# Patient Record
Sex: Female | Born: 1960 | Hispanic: Yes | Marital: Single
Health system: Southern US, Academic
[De-identification: ages and names within clinical notes are randomized; demographics above are authoritative.]

## PROBLEM LIST (undated history)

## (undated) ENCOUNTER — Ambulatory Visit

## (undated) ENCOUNTER — Encounter: Attending: Rheumatology | Primary: Rheumatology

## (undated) ENCOUNTER — Encounter: Attending: Federally Qualified Health Center (FQHC) | Primary: Federally Qualified Health Center (FQHC)

## (undated) ENCOUNTER — Ambulatory Visit: Attending: Ambulatory Care | Primary: Ambulatory Care

## (undated) ENCOUNTER — Encounter: Attending: Pediatrics | Primary: Pediatrics

## (undated) ENCOUNTER — Encounter

## (undated) ENCOUNTER — Encounter: Attending: General Acute Care Hospital | Primary: General Acute Care Hospital

## (undated) ENCOUNTER — Telehealth: Attending: Ambulatory Care | Primary: Ambulatory Care

## (undated) ENCOUNTER — Encounter: Attending: Ambulatory Care | Primary: Ambulatory Care

## (undated) ENCOUNTER — Telehealth

## (undated) ENCOUNTER — Telehealth: Attending: Rheumatology | Primary: Rheumatology

## (undated) ENCOUNTER — Ambulatory Visit: Attending: Family Medicine | Primary: Family Medicine

## (undated) ENCOUNTER — Encounter: Attending: Medical | Primary: Medical

## (undated) ENCOUNTER — Ambulatory Visit: Attending: Adult Health | Primary: Adult Health

## (undated) ENCOUNTER — Encounter
Attending: Student in an Organized Health Care Education/Training Program | Primary: Student in an Organized Health Care Education/Training Program

## (undated) ENCOUNTER — Ambulatory Visit
Attending: Rehabilitative and Restorative Service Providers" | Primary: Rehabilitative and Restorative Service Providers"

## (undated) ENCOUNTER — Ambulatory Visit: Attending: Physical Medicine & Rehabilitation | Primary: Physical Medicine & Rehabilitation

## (undated) ENCOUNTER — Encounter: Attending: Family Medicine | Primary: Family Medicine

## (undated) ENCOUNTER — Encounter: Attending: Family | Primary: Family

## (undated) ENCOUNTER — Ambulatory Visit: Attending: Rheumatology | Primary: Rheumatology

## (undated) DIAGNOSIS — M359 Systemic involvement of connective tissue, unspecified: Secondary | ICD-10-CM

## (undated) DIAGNOSIS — I10 Essential (primary) hypertension: Secondary | ICD-10-CM

## (undated) DIAGNOSIS — M5430 Sciatica, unspecified side: Secondary | ICD-10-CM

## (undated) DIAGNOSIS — M1990 Unspecified osteoarthritis, unspecified site: Secondary | ICD-10-CM

## (undated) DIAGNOSIS — M810 Age-related osteoporosis without current pathological fracture: Secondary | ICD-10-CM

## (undated) DIAGNOSIS — J45909 Unspecified asthma, uncomplicated: Secondary | ICD-10-CM

## (undated) HISTORY — PX: CHOLECYSTECTOMY: SHX55

## (undated) HISTORY — PX: UTERINE FIBROID SURGERY: SHX826

## (undated) MED ORDER — HYDRALAZINE 25 MG TABLET: Freq: Three times a day (TID) | ORAL | 0 days

## (undated) MED ORDER — NORTRIPTYLINE 25 MG CAPSULE
Freq: Every evening | ORAL | 0 days
Start: ? — End: 2020-03-27

## (undated) MED ORDER — HYDROCHLOROTHIAZIDE 25 MG TABLET
Freq: Every day | ORAL | 0.00000 days
Start: ? — End: 2020-03-27

## (undated) MED ORDER — METOPROLOL SUCCINATE ER 100 MG TABLET,EXTENDED RELEASE 24 HR
Freq: Every day | ORAL | 0 days
Start: ? — End: 2020-03-27

---
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
## 2013-03-29 ENCOUNTER — Emergency Department: Payer: Self-pay | Admitting: Emergency Medicine

## 2013-04-06 ENCOUNTER — Inpatient Hospital Stay: Payer: Self-pay | Admitting: Internal Medicine

## 2013-04-06 LAB — BASIC METABOLIC PANEL
Anion Gap: 5 — ABNORMAL LOW (ref 7–16)
BUN: 13 mg/dL (ref 7–18)
CALCIUM: 9.1 mg/dL (ref 8.5–10.1)
CHLORIDE: 105 mmol/L (ref 98–107)
Co2: 28 mmol/L (ref 21–32)
Creatinine: 0.7 mg/dL (ref 0.60–1.30)
EGFR (African American): >60
EGFR (Non-African Amer.): >60
Glucose: 132 mg/dL — ABNORMAL HIGH (ref 65–99)
OSMOLALITY: 278 (ref 275–301)
Potassium: 3.6 mmol/L (ref 3.5–5.1)
Sodium: 138 mmol/L (ref 136–145)

## 2013-04-06 LAB — CBC
HCT: 38.6 % (ref 35.0–47.0)
HGB: 12.7 g/dL (ref 12.0–16.0)
MCH: 26.4 pg (ref 26.0–34.0)
MCHC: 33 g/dL (ref 32.0–36.0)
MCV: 80 fL (ref 80–100)
PLATELETS: 340 10*3/uL (ref 150–440)
RBC: 4.82 10*6/uL (ref 3.80–5.20)
RDW: 14 % (ref 11.5–14.5)
WBC: 10 10*3/uL (ref 3.6–11.0)

## 2013-04-06 LAB — RAPID INFLUENZA A&B ANTIGENS (ARMC ONLY)

## 2013-04-06 LAB — HEMOGLOBIN A1C: Hemoglobin A1C: 6.4 % — ABNORMAL HIGH (ref 4.2–6.3)

## 2013-04-06 LAB — TROPONIN I: Troponin-I: <0.02 ng/mL

## 2013-04-07 LAB — BASIC METABOLIC PANEL
Anion Gap: 7 (ref 7–16)
BUN: 12 mg/dL (ref 7–18)
CHLORIDE: 106 mmol/L (ref 98–107)
CO2: 24 mmol/L (ref 21–32)
CREATININE: 0.71 mg/dL (ref 0.60–1.30)
Calcium, Total: 10.2 mg/dL — ABNORMAL HIGH (ref 8.5–10.1)
EGFR (Non-African Amer.): >60
Glucose: 194 mg/dL — ABNORMAL HIGH (ref 65–99)
OSMOLALITY: 279 (ref 275–301)
Potassium: 4.1 mmol/L (ref 3.5–5.1)
Sodium: 137 mmol/L (ref 136–145)

## 2013-12-31 ENCOUNTER — Emergency Department: Payer: Self-pay | Admitting: Student

## 2014-04-10 ENCOUNTER — Emergency Department: Payer: Self-pay | Admitting: Emergency Medicine

## 2014-06-11 NOTE — H&P (Signed)
PATIENT NAME:  Maria Griffin, Jeidi I MR#:  [id number] DATE OF BIRTH:  [birth year]  DATE OF ADMISSION:  04/06/2013  ADDENDUM   It is noted that patient has accelerated hypertension initially. This is likely secondary to her steroids and asthma but will follow closely. Have written for p.r.n. hydralazine order.   SIRS syndrome: The patient meets criteria for SIRS  with increased respiratory rate and increase in pulse on admission. This is secondary to asthma exacerbation.    ____________________________ Janyth ContesSital P. Juliene PinaMody, MD spm:jcm D: 04/06/2013 17:39:56 ET T: 04/06/2013 17:58:35 ET JOB#: [id number]  cc: Silvano Garofano P. Juliene PinaMody, MD, <Dictator> Janyth ContesSITAL P Shacoria Latif MD ELECTRONICALLY SIGNED 04/06/2013 18:56

## 2014-06-11 NOTE — Discharge Summary (Signed)
PATIENT NAME:  Maria Griffin, [name] MR#:  [id number] DATE OF BIRTH:  08-25-1960  DATE OF ADMISSION:  04/06/2013 DATE OF DISCHARGE:  04/07/2013  For a detailed note, please  look at the history and physical done on admission by Dr. Adrian SaranSital Mody.   DIAGNOSES AT DISCHARGE: As follows:  1. Asthma exacerbation.  2. Acute bronchitis.   DIET: The patient is being discharged on a regular diet.   ACTIVITY: As tolerated.   FOLLOW-UP: At [facility] in the next 1 to 2 weeks.  DISCHARGE MEDICATIONS:  Are as follows: Albuterol inhaler 2 puffs q.4 hours as needed, Advair 250 1 puff b.i.d., and prednisone taper starting at 50 mg down to 10 mg over the next five days.   PERTINENT STUDIES DONE DURING THE HOSPITAL COURSE: A chest x-ray done on admission showing no acute cardiopulmonary disease.   HOSPITAL COURSE: This is a 54 year old female who presented to the hospital with shortness of breath, cough and wheezing and noted to be in asthma exacerbation.  1. Asthma exacerbation. This was likely the cause of the patient's shortness of breath, wheezing and cough. The patient was admitted to the hospital, started on IV steroids, around-the-clock nebulizer treatments and also started on Advair. She was also given oral doxycycline, although her chest x-ray is negative for any pneumonia. After getting maximal therapy, the patient's shortness of breath and bronchospasm and significantly improved. She was ambulated on room air, did not desaturate below 95%. She was only on albuterol inhaler for asthma and not on any maintenance inhaler so I discharged home on some oral Advair. The patient was in agreement with this plan and therefore was discharged home on oral prednisone, along with Advair and albuterol inhaler as needed.   2. The patient is a FULL CODE.   TIME SPENT: 35 minutes.    ____________________________ Rolly PancakeVivek J. Cherlynn KaiserSainani, MD vjs:sg D: 04/07/2013 16:10:00 ET T: 04/07/2013 16:39:17  ET JOB#: [id number]  cc: Rolly PancakeVivek J. Cherlynn KaiserSainani, MD, <Dictator> [facility]  Houston SirenVIVEK J Lanice Folden MD ELECTRONICALLY SIGNED 04/16/2013 18:03

## 2014-06-11 NOTE — H&P (Signed)
PATIENT NAME:  Maria Griffin, Maria Griffin MR#:  [id number] DATE OF BIRTH:  September 01, 1960  DATE OF ADMISSION:  04/06/2013  [facility]    CHIEF COMPLAINT: Asthma exacerbation.  HISTORY OF PRESENT ILLNESS: This is a very pleasant 54 year old Hispanic woman with a  history of asthma and rheumatoid arthritis who presents with the above complaint. The patient was seen about a week ago with asthma exacerbation in the ER. She was sent home with albuterol inhaler as well as prednisone taper. She actually failed treatment because she presents with ongoing wheezing, short of breath, dyspnea on exertion, and worsening of her symptoms.   In the ER, chest x-ray showed no acute pulmonary infiltrates. She was given IV Solu-Medrol, nebulizers and still continues to have tight chest with wheezing.   REVIEW OF SYSTEMS:  CONSTITUTIONAL: Positive chills. Positive weakness and fatigue.  EYES: No blurred or double vision or glaucoma.  ENT: No ear pain, discharge, epistaxis, or snoring.  RESPIRATORY: Positive cough, positive wheezing, positive dyspnea. Positive history of asthma.  CARDIOVASCULAR: No chest pain, orthopnea, palpitations, syncope, edema, arrhythmia. Positive dyspnea on exertion.  GASTROINTESTINAL: No nausea, vomiting, diarrhea, abdominal pain, melena or ulcers.  GENITOURINARY: No dysuria or hematuria.  ENDOCRINE: No polyphagia, polydipsia.  HEMOLYMPHATIC: No anemia or easy bruising.  SKIN: No rash or lesions.  MUSCULOSKELETAL: No limited activity.  NEUROLOGIC: No history of CVA, TIA or seizures. PSYCHIATRIC: No history of anxiety or depression.   PAST MEDICAL HISTORY:  1.  Rheumatoid arthritis.  2.  Asthma.   PAST SURGICAL HISTORY: None.   MEDICATIONS:  1.  She was on a steroid taper given here about a week.  2.  Ventolin HFA.   ALLERGIES: MANY ALLERGIES INCLUDING PET DANDER, DUST, AND POLLEN.  MORPHINE SULFATE CAUSES NAUSEA, VOMITING, DIARRHEA AND SWELLING.    SOCIAL HISTORY: No tobacco, alcohol or IV drug use.   FAMILY HISTORY: No history of CAD or diabetes.   PHYSICAL EXAMINATION:  VITAL SIGNS: Temperature 98.3, pulse 112, respirations 25, blood pressure 190/86, 96% on room air.  GENERAL: Alert, in moderate distress. No tripoding is noted.  HEENT: Atraumatic. Pupils are round and reactive. Sclerae anicteric. Mucous membranes are  dry. Oropharynx clear.  NECK: Supple without JVD, carotid bruit, enlarged thyroid.  CARDIOVASCULAR: Tachycardia without murmur, gallops, or rubs. PMI is not displaced.  LUNGS: Diffuse wheezing. Good air movement bilaterally. She has no rales or rhonchi. Normal chest expansion. No dullness to percussion.  ABDOMEN: Bowel sounds positive. Nontender, nondistended. No hepatosplenomegaly.   EXTREMITIES: No clubbing, cyanosis, or edema. NEUROLOGIC: Cranial nerves II through XII are intact. There are no focal deficits. SKIN: Without rash or lesions.  MUSCULOSKELETAL: With 5/5 strength in all extremities.   LABORATORY DATA: Sodium 138, potassium 3.6, chloride 105, bicarbonate 24 , BUN 13, creatinine 0.70. Glucose is 132. Troponin less than 0.02. White blood cells 10, hemoglobin 12.7, hematocrit 39, platelets 340.   Chest x-ray shows no acute cardiopulmonary disease.   EKG shows sinus tachycardia. No ST elevations or depression.   ASSESSMENT AND PLAN: This is a 54 year old female who was treated for a week with steroid taper and albuterol for asthma exacerbation who presents with asthma exacerbation with failed outpatient treatment.  1.  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 The patient will be admitted and we will continue IV Solu-Medrol, DuoNebs, started Advair. We will continue to monitor. She does have good air flow  at this time but she does have wheezing. Griffin have also empirically started her on doxycycline. She has no evidence of a  pneumonia.  2.  History of rheumatoid arthritis. The patient cannot afford outpatient medications including  Enbrel. Her last dose was over 2 months ago.  3.  Hyperglycemia, likely from steroids. Will check a hemoglobin A1c.  4.  Multiple allergies. The patient need to see an allergist as an outpatient.   CODE STATUS: FULL CODE.   TIME SPENT: Approximately 45 minutes.   ____________________________ Janyth Contes. Juliene Pina, MD spm:np D: 04/06/2013 17:25:57 ET T: 04/06/2013 18:07:10 ET JOB#: [id number]  cc: Brieanne Mignone P. Juliene Pina, MD, <Dictator> [facility] P Mea Ozga MD ELECTRONICALLY SIGNED 04/06/2013 18:57

## 2014-06-17 ENCOUNTER — Emergency Department
Admit: 2014-06-17 | Disposition: A | Discharge status: Home / Self Care | Payer: Self-pay | Admitting: Emergency Medicine

## 2014-06-17 LAB — BASIC METABOLIC PANEL
Anion Gap: 8 (ref 7–16)
BUN: 16 mg/dL
CO2: 27 mmol/L
Calcium, Total: 9.1 mg/dL
Chloride: 105 mmol/L
Creatinine: 0.52 mg/dL
GLUCOSE: 98 mg/dL
Potassium: 3.5 mmol/L
Sodium: 140 mmol/L

## 2014-06-17 LAB — CBC
HCT: 31.4 % — AB (ref 35.0–47.0)
HGB: 9.7 g/dL — AB (ref 12.0–16.0)
MCH: 21.2 pg — AB (ref 26.0–34.0)
MCHC: 30.7 g/dL — ABNORMAL LOW (ref 32.0–36.0)
MCV: 69 fL — AB (ref 80–100)
Platelet: 606 10*3/uL — ABNORMAL HIGH (ref 150–440)
RBC: 4.54 10*6/uL (ref 3.80–5.20)
RDW: 18.3 % — ABNORMAL HIGH (ref 11.5–14.5)
WBC: 8.7 10*3/uL (ref 3.6–11.0)

## 2014-06-17 LAB — TROPONIN I

## 2014-10-07 ENCOUNTER — Encounter: Payer: Self-pay | Admitting: Emergency Medicine

## 2014-10-07 ENCOUNTER — Emergency Department
Admission: EM | Admit: 2014-10-07 | Discharge: 2014-10-07 | Disposition: A | Discharge status: Home / Self Care | Payer: Self-pay | Attending: Emergency Medicine | Admitting: Emergency Medicine

## 2014-10-07 DIAGNOSIS — F321 Major depressive disorder, single episode, moderate: Secondary | ICD-10-CM | POA: Insufficient documentation

## 2014-10-07 DIAGNOSIS — M170 Bilateral primary osteoarthritis of knee: Secondary | ICD-10-CM | POA: Insufficient documentation

## 2014-10-07 DIAGNOSIS — M1990 Unspecified osteoarthritis, unspecified site: Secondary | ICD-10-CM

## 2014-10-07 DIAGNOSIS — F329 Major depressive disorder, single episode, unspecified: Secondary | ICD-10-CM

## 2014-10-07 DIAGNOSIS — F32A Depression, unspecified: Secondary | ICD-10-CM

## 2014-10-07 LAB — COMPREHENSIVE METABOLIC PANEL
ALT: 14 U/L (ref 14–54)
AST: 22 U/L (ref 15–41)
Albumin: 3.7 g/dL (ref 3.5–5.0)
Alkaline Phosphatase: 121 U/L (ref 38–126)
Anion gap: 8 (ref 5–15)
BILIRUBIN TOTAL: 0.4 mg/dL (ref 0.3–1.2)
BUN: 13 mg/dL (ref 6–20)
CO2: 28 mmol/L (ref 22–32)
Calcium: 9.3 mg/dL (ref 8.9–10.3)
Chloride: 105 mmol/L (ref 101–111)
Creatinine, Ser: 0.48 mg/dL (ref 0.44–1.00)
Glucose, Bld: 105 mg/dL — ABNORMAL HIGH (ref 65–99)
POTASSIUM: 4.1 mmol/L (ref 3.5–5.1)
Sodium: 141 mmol/L (ref 135–145)
TOTAL PROTEIN: 7.4 g/dL (ref 6.5–8.1)

## 2014-10-07 LAB — CBC
HCT: 29.6 % — ABNORMAL LOW (ref 35.0–47.0)
Hemoglobin: 9.1 g/dL — ABNORMAL LOW (ref 12.0–16.0)
MCH: 19.8 pg — ABNORMAL LOW (ref 26.0–34.0)
MCHC: 30.5 g/dL — ABNORMAL LOW (ref 32.0–36.0)
MCV: 64.8 fL — ABNORMAL LOW (ref 80.0–100.0)
PLATELETS: 592 10*3/uL — AB (ref 150–440)
RBC: 4.58 MIL/uL (ref 3.80–5.20)
RDW: 18.7 % — AB (ref 11.5–14.5)
WBC: 8 10*3/uL (ref 3.6–11.0)

## 2014-10-07 LAB — URINE DRUG SCREEN, QUALITATIVE (ARMC ONLY)
AMPHETAMINES, UR SCREEN: NOT DETECTED
BENZODIAZEPINE, UR SCRN: NOT DETECTED
Barbiturates, Ur Screen: NOT DETECTED
Cannabinoid 50 Ng, Ur ~~LOC~~: NOT DETECTED
Cocaine Metabolite,Ur ~~LOC~~: NOT DETECTED
MDMA (Ecstasy)Ur Screen: NOT DETECTED
Methadone Scn, Ur: NOT DETECTED
Opiate, Ur Screen: NOT DETECTED
PHENCYCLIDINE (PCP) UR S: NOT DETECTED
Tricyclic, Ur Screen: NOT DETECTED

## 2014-10-07 LAB — SALICYLATE LEVEL

## 2014-10-07 LAB — ACETAMINOPHEN LEVEL: Acetaminophen (Tylenol), Serum: 13 ug/mL (ref 10–30)

## 2014-10-07 LAB — ETHANOL

## 2014-10-07 MED ORDER — PREDNISONE 5 MG PO TABS: 10.0000 mg | ORAL_TABLET | Freq: Every day | ORAL | Status: AC

## 2014-10-07 MED ORDER — IBUPROFEN 600 MG PO TABS
ORAL_TABLET | ORAL | Status: AC
  Administered 2014-10-07: 600 mg via ORAL
  Filled 2014-10-07: qty 1

## 2014-10-07 MED ORDER — IBUPROFEN 600 MG PO TABS
600.0000 mg | ORAL_TABLET | Freq: Once | ORAL | Status: AC
  Administered 2014-10-07: 600 mg via ORAL

## 2014-10-07 MED ORDER — ACETAMINOPHEN 500 MG PO TABS
1000.0000 mg | ORAL_TABLET | ORAL | Status: AC
  Administered 2014-10-07: 1000 mg via ORAL
  Filled 2014-10-07: qty 2

## 2014-10-07 MED ORDER — IBUPROFEN 600 MG PO TABS
600.0000 mg | ORAL_TABLET | ORAL | Status: AC
  Administered 2014-10-07: 600 mg via ORAL
  Filled 2014-10-07: qty 1

## 2014-10-07 NOTE — ED Notes (Signed)
BEHAVIORAL HEALTH ROUNDING Patient sleeping: No. Patient alert and oriented: yes Behavior appropriate: Yes.  ; If no, describe:  Nutrition and fluids offered: Yes  Toileting and hygiene offered: Yes  Sitter present: yes Law enforcement present: Yes  

## 2014-10-07 NOTE — ED Notes (Signed)
Pt changed into hospital scrubs by Carolan Shiver EDT and Sheralyn Boatman EDT, patient belongings sent with patients daughter.

## 2014-10-07 NOTE — ED Notes (Signed)
BEHAVIORAL HEALTH ROUNDING Patient sleeping: Yes.   Patient alert and oriented: yes Behavior appropriate: Yes.  ; If no, describe:  Nutrition and fluids offered: Yes  Toileting and hygiene offered: No Sitter present: yes Law enforcement present: Yes

## 2014-10-07 NOTE — ED Notes (Signed)

## 2014-10-07 NOTE — ED Provider Notes (Signed)
Patient cleared for discharge by Dr. Jenne Campus of psychiatry. I will write for prednisone as that was one of her exacerbating factors is that she ran out of prednisone  Jene Every, MD 10/07/14 2241

## 2014-10-07 NOTE — ED Notes (Signed)
Patient moved to 24H. Patient is spanish speaking, but can understand minimal [place]. Daughter at bedside explained rules and regulations for visitation and phone use.  Explained to daughter that interpreter will be used to ask patient questions.  Daughter did inform me that the pharmacy she uses is at [facility].

## 2014-10-07 NOTE — ED Notes (Signed)
apinful joints, diff to walk.  Has had before.

## 2014-10-07 NOTE — ED Notes (Signed)
BEHAVIORAL HEALTH ROUNDING Patient sleeping: Yes.   Patient alert and oriented: yes Behavior appropriate: No.; If no, describe:  Nutrition and fluids offered: Yes  Toileting and hygiene offered: Yes  Sitter present: yes Law enforcement present: Yes
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
## 2014-10-07 NOTE — ED Notes (Signed)
DR Jenne Campus and Interpreters to pt bedside at this time.

## 2014-10-07 NOTE — Discharge Instructions (Signed)
Trastorno depresivo mayor °(Major Depressive Disorder) °El trastorno depresivo mayor es una enfermedad mental. También se llamado depresión clínica o depresión unipolar. Produce sentimientos de tristeza, desesperanza o desamparo. Algunas personas con trastorno depresivo mayor no se sienten particularmente tristes, pero pierden el interés en hacer las cosas que solían disfrutar (anhedonia). También puede causar síntomas físicos. Interfiere en el trabajo, la escuela, las relaciones y otras actividades diarias normales. Puede variar en gravedad, pero es más duradera y más grave que la tristeza que todos sentimos de vez en cuando en nuestras vidas.  °Muchas veces es desencadenada por sucesos estresantes o cambios importantes en la vida. Algunos ejemplos de estos factores desencadenantes son el divorcio, la pérdida del trabajo o el hogar, una mudanza, y la muerte de un familiar o amigo cercano. A veces aparece sin ninguna razón evidente. Las personas que tienen familiares con depresión mayor o con trastorno bipolar tienen más riesgo de desarrollar depresión mayor con o sin factores de estrés. Puede ocurrir a cualquier edad. Puede ocurrir sólo una vez en su vida(episodio único de trastorno depresivo mayor). Puede ocurrir varias veces (trastorno depresivo mayor recurrente).  °SÍNTOMAS  °Las personas con trastorno depresivo mayor presentan anhedonia o estado de ánimo deprimido casi todos los días durante al menos 2 semanas o más. Los síntomas son:  °· Sensación de tristeza (melancolía) o vacío. °· Sentimientos de desesperanza o desamparo. °· Lagrimeo o episodios de llanto ( es observado por los demás). °· Irritabilidad (en niños y adolescentes). °Además del estado de ánimo deprimido o la anhedonia o ambos, estos enfermos tienen al menos cuatro de los siguientes síntomas :  °· Dificultad para dormir o dormir demasiado.   °· Cambio significativo (aumento o disminución) en el apetito o el peso.   °· Falta de energía o  motivación. °· Sentimientos de culpa o desvalorización.   °· Dificultad para concentrarse, recordar o tomar decisiones. °· Movimientos inusualmente lentos (retardo psicomotor retardation) o inquietud (según lo observado por los demás).   °· Deseos recurrentes de muerte, pensamientos recurrentes de autoagresión (suicidio) o intento de suicidio. °Las personas con trastorno depresivo mayor suelen tener pensamientos persistentes negativos acerca de sí mismos, de otras personas y del mundo. Las personas con trastorno depresivo mayor grave pueden experimentar creencias o percepciones distorsionadas sobre el mundo (delirios psicóticos). También pueden ver u oír cosas que no son reales(alucinaciones psicóticas).  °DIAGNÓSTICO  °El diagnóstico se realiza mediante una evaluación hecha por el médico. El médico le preguntará acerca de los aspectos de su vida cotidiana, como el estado de ánimo, el sueño y el apetito, para ver si usted tiene los síntomas de depresión mayor. Le hará preguntas sobre su historial médico y el consumo de alcohol o drogas, incluyendo medicamentos recetados. También le hará un examen físico y le indicará análisis de sangre. Esto se debe a que ciertas enfermedades y el uso de determinadas sustancias pueden causar síntomas similares a la depresión (depresión secundaria). Su médico también podría derivarlo a un especialista en salud mental para una evaluación y tratamiento.  °TRATAMIENTO  °Es importante reconocer los síntomas y buscar tratamiento. Los siguientes tratamientos pueden indicarse:    °· Medicamentos - Generalmente se recetan antidepresivos. Los antidepresivos se piensa que corrigen los desequilibrios químicos en el cerebro que se asocian comúnmente a la depresión mayor. Se pueden agregar otros tipos de medicamentos si los síntomas no responden a los antidepresivos solos o si hay ideas delirantes o alucinaciones psicóticas. °· Psicoterapia - ciertos tipos de psicoterapia pueden ser útiles en el  tratamiento del trastorno de   la depresión mayor, proporcionando apoyo, educación y orientación. Ciertos tipos de psicoterapia también pueden ayudar a superar los pensamientos negativos (terapia cognitivo conductual) y a problemas de relación que desencadena la depresión mayor (terapia interpersonal). °Un especialista en salud mental puede ayudarlo a determinar qué tratamiento es el mejor para usted. La mayoría de los pacientes mejoran con una combinación de medicación y psicoterapia. Los tratamientos que implican la estimulación eléctrica del cerebro pueden ser utilizados en situaciones con síntomas muy graves o cuando los medicamentos y la psicoterapia no funcionan después de un tiempo. Estos tratamientos incluyen terapia electroconvulsiva, estimulación magnética transcraneal y estimulación del nervio vago.  °Document Released: 06/01/2012 Document Revised: 06/21/2013 °ExitCare® Patient Information ©2015 [employer]. This information is not intended to replace advice given to you by your health care provider. Make sure you discuss any questions you have with your health care provider. ° °

## 2014-10-07 NOTE — ED Notes (Signed)
BH intake notified that patient's daughter in the waiting room.  Explained to the daughter again about phone privileges and visitation policies.

## 2014-10-07 NOTE — ED Notes (Signed)
BEHAVIORAL HEALTH ROUNDING Patient sleeping: No. Patient alert and oriented: yes Behavior appropriate: Yes.   Nutrition and fluids offered: Yes  Toileting and hygiene offered: Yes  Sitter present: q15 min observations and security camera monitoring Law enforcement present: Yes [employer]  ENVIRONMENTAL ASSESSMENT Potentially harmful objects out of patient reach: Yes.   Personal belongings secured: Yes.   Patient dressed in hospital provided attire only: Yes.   Plastic bags out of patient reach: Yes.   Patient care equipment removed: Yes.   Equipment and supplies removed: Yes.   Potentially toxic materials out of patient reach: Yes.   Sharps container removed or out of patient reach: Yes.    ED BHU PLACEMENT JUSTIFICATION Is the patient under IVC or is there intent for IVC: No. Is the patient medically cleared: Yes.   Is there vacancy in the ED BHU: Yes.   Is the population mix appropriate for patient: No. Is the patient awaiting placement in inpatient or outpatient setting: No. Has the patient had a psychiatric consult: Yes.   Survey of unit performed for contraband, proper placement and condition of furniture, tampering with fixtures in bathroom, shower, and each patient room: Yes.  ; Findings: none APPEARANCE/BEHAVIOR Calm, cooperative NEURO ASSESSMENT Orientation: A&O x4 Hallucinations: None noted Speech: Normal Gait: normal RESPIRATORY ASSESSMENT No respiratory distress noted CARDIOVASCULAR ASSESSMENT Skin color appropriate for age and race GASTROINTESTINAL ASSESSMENT no GI distress noted EXTREMITIES Moves all extremities PLAN OF CARE Provide calm/safe environment. Vital signs assessed twice daily. ED BHU Assessment once each 12-hour shift. Collaborate with intake RN daily or as condition indicates. Assure the ED provider has rounded once each shift. Provide and encourage hygiene. Provide redirection as needed. Assess for escalating behavior; address immediately and  inform ED provider.  Assess family dynamic and appropriateness for visitation as needed: Yes.   Educate the patient/family about BHU procedures/visitation: Yes.    

## 2014-10-07 NOTE — ED Notes (Signed)
Behavioral health team at bedside with interpreter present as well.

## 2014-10-07 NOTE — ED Notes (Addendum)
BEHAVIORAL HEALTH ROUNDING Patient sleeping: No. Patient alert and oriented: yes Behavior appropriate: Yes.  ; If no, describe:  Nutrition and fluids offered: Yes  Toileting and hygiene offered: Yes  Sitter present: Yes Law enforcement present: Yes  

## 2014-10-07 NOTE — ED Notes (Signed)
ED BHU PLACEMENT JUSTIFICATION Is the patient under IVC or is there intent for IVC: No. Is the patient medically cleared: Yes.   Is there vacancy in the ED BHU: Yes.   Is the population mix appropriate for patient: Yes.   Is the patient awaiting placement in inpatient or outpatient setting: No.  Not at this time.   Has the patient had a psychiatric consult: No. Survey of unit performed for contraband, proper placement and condition of furniture, tampering with fixtures in bathroom, shower, and each patient room: Yes.  ; Findings: none APPEARANCE/BEHAVIOR calm, cooperative and adequate rapport can be established NEURO ASSESSMENT Orientation: time, place and person Hallucinations: No.None noted (Hallucinations) Speech: Normal Gait: normal RESPIRATORY ASSESSMENT Normal expansion.  Clear to auscultation.  No rales, rhonchi, or wheezing. CARDIOVASCULAR ASSESSMENT regular rate and rhythm, S1, S2 normal, no murmur, click, rub or gallop GASTROINTESTINAL ASSESSMENT soft, nontender, BS WNL, no r/g EXTREMITIES normal strength, tone, and muscle mass PLAN OF CARE Provide calm/safe environment. Vital signs assessed twice daily. ED BHU Assessment once each 12-hour shift. Collaborate with intake RN daily or as condition indicates. Assure the ED provider has rounded once each shift. Provide and encourage hygiene. Provide redirection as needed. Assess for escalating behavior; address immediately and inform ED provider.  Assess family dynamic and appropriateness for visitation as needed: Yes.  ; If necessary, describe findings:  Educate the patient/family about BHU procedures/visitation: Yes.  ; If necessary, describe findings:

## 2014-10-07 NOTE — ED Notes (Signed)
BEHAVIORAL HEALTH ROUNDING Patient sleeping: No. Patient alert and oriented: yes Behavior appropriate: Yes.  ; If no, describe:  Nutrition and fluids offered: Yes  Toileting and hygiene offered: Yes  Sitter present: q 15 min checks Law enforcement present: Yes  

## 2014-10-07 NOTE — ED Notes (Signed)
Report received from RN Anna.

## 2014-10-07 NOTE — ED Notes (Signed)
Patient assigned to appropriate care area. Patient oriented to unit/care area: Informed that, for their safety, care areas are designed for safety and monitored by security cameras at all times; and visiting hours explained to patient. Patient verbalizes understanding, and verbal contract for safety obtained. 

## 2014-10-07 NOTE — ED Notes (Signed)
BEHAVIORAL HEALTH ROUNDING Patient sleeping: Yes.   Patient alert and oriented: yes Behavior appropriate: Yes.  ; If no, describe:  Nutrition and fluids offered: Yes  Toileting and hygiene offered: Yes  Sitter present: yes Law enforcement present: Yes  

## 2014-10-07 NOTE — ED Provider Notes (Signed)
[facility] Emergency Department Provider Note  ____________________________________________  Time seen: Approximately 1:30 PM  I have reviewed the triage vital signs and the nursing notes.  History and physical performed with Spanish interpreter  HISTORY  Chief Complaint Joint Pain and Depression    HPI Maria Griffin is a 54 y.o. female reports she has a history of bad arthritis. She states that she's been having severe joint pain in her knees and hands for years, but it came to the point about 3 days ago that she became so frustrated with the pain that she felt as though living life was worthless. She denies wanting to harm herself, but she reports that she just feels very hopeless and had an idea briefly that she could hit herself in the head with a pain in, but denies that she would do this.  She denies any previous history of psychiatric disease. She denies any actual attempt to harm herself, just states that she is just so sad that her arthritis is debilitating.   No past medical history on file.  There are no active problems to display for this patient.   No past surgical history on file.  No current outpatient prescriptions on file.  Allergies Morphine and related  No family history on file.  Social History Social History  Substance Use Topics  . Smoking status: Never Smoker   . Smokeless tobacco: None  . Alcohol Use: No    Review of Systems Constitutional: No fever/chills Eyes: No visual changes. ENT: No sore throat. Cardiovascular: Denies chest pain. Respiratory: Denies shortness of breath. Gastrointestinal: No abdominal pain.  No nausea, no vomiting.  No diarrhea.  No constipation. Genitourinary: Negative for dysuria. Musculoskeletal: Negative for back pain. She does have pain in both knees, and all the joints of the hand due to "arthritis" which she has had for years. No red, inflamed joints. Skin: Negative for  rash. Neurological: Negative for headaches, focal weakness or numbness.  10-point ROS otherwise negative.  ____________________________________________   PHYSICAL EXAM:  VITAL SIGNS: ED Triage Vitals  Enc Vitals Group     BP 10/07/14 1135 148/64 mmHg     Pulse Rate 10/07/14 1135 80     Resp 10/07/14 1135 14     Temp 10/07/14 1135 98.8 F (37.1 C)     Temp Source 10/07/14 1135 Oral     SpO2 10/07/14 1135 97 %     Weight 10/07/14 1135 156 lb (70.761 kg)     Height 10/07/14 1135  (1.549 m)     Head Cir --      Peak Flow --      Pain Score 10/07/14 1136 10     Pain Loc --      Pain Edu? --      Excl. in GC? --     Constitutional: Alert and oriented. Well appearing and in no acute distress. She does have some tears when discussing her arthritis. Eyes: Conjunctivae are normal. PERRL. EOMI. Head: Atraumatic. Nose: No congestion/rhinnorhea. Mouth/Throat: Mucous membranes are moist.  Oropharynx non-erythematous. Neck: No stridor.   Cardiovascular: Normal rate, regular rhythm. Grossly normal heart sounds.  Good peripheral circulation. Respiratory: Normal respiratory effort.  No retractions. Lungs CTAB. Gastrointestinal: Soft and nontender. No distention. No abdominal bruits. No CVA tenderness. Musculoskeletal: No lower extremity tenderness nor edema.  No joint effusions. She does have arthritic changes of both knees. No erythema. She has good range of motion, reports that moving her knees and  use of her hands causes severe aching. Neurologic:  Normal speech and language. No gross focal neurologic deficits are appreciated.  Skin:  Skin is warm, dry and intact. No rash noted. Psychiatric: Mood and affect are somewhat sad and tearful at times. Speech and behavior are normal.  ____________________________________________   LABS (all labs ordered are listed, but only abnormal results are displayed)  Labs Reviewed  COMPREHENSIVE METABOLIC PANEL - Abnormal; Notable for the  following:    Glucose, Bld 105 (*)    All other components within normal limits  CBC - Abnormal; Notable for the following:    Hemoglobin 9.1 (*)    HCT 29.6 (*)    MCV 64.8 (*)    MCH 19.8 (*)    MCHC 30.5 (*)    RDW 18.7 (*)    Platelets 592 (*)    All other components within normal limits  ETHANOL  SALICYLATE LEVEL  ACETAMINOPHEN LEVEL  URINE DRUG SCREEN, QUALITATIVE ([facility] ONLY)   ____________________________________________  EKG   ____________________________________________  RADIOLOGY   ____________________________________________   PROCEDURES  Procedure(s) performed: None  Critical Care performed: No  ____________________________________________   INITIAL IMPRESSION / ASSESSMENT AND PLAN / ED COURSE  Pertinent labs & imaging results that were available during my care of the patient were reviewed by me and considered in my medical decision making (see chart for details).  Patient presents with symptoms of severe depression. She had fleeting thoughts of hurting herself, but she denies she would actually take any action. She does not appear to be any clear distress and appears to have long-standing severe arthritis.  Because of her symptoms, we will refer her to psychiatry for evaluation today. I do not believe she needs to be on involuntary commitment as she reports that she would never taken action actually harm herself, she is not currently suicidal.  Refer her to her primary care doctor for ongoing management of her arthritis. She is medically cleared for psychiatric evaluation at this time.  Care and disposition assigned to Dr. Rochel Brome to follow-up with psychiatry recommendations. ____________________________________________   FINAL CLINICAL IMPRESSION(S) / ED DIAGNOSES  Final diagnoses:  Chronic arthritis  Major depressive disorder, single episode, moderate      Sharyn Creamer, MD 10/07/14 [phone]

## 2014-10-07 NOTE — ED Notes (Signed)
Pt expressing feeling of hopelessness and depression, with increasing pain, pt is currently out of medications

## 2014-10-07 NOTE — ED Notes (Signed)
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 She felt useless and hopeless that she had the idea to try to harm herself by hitting herself in the head with a frying pan.  Patient did not attempt to harm herself. Noticeable increase in depression by patient and family as well per daughter.

## 2014-10-07 NOTE — ED Notes (Signed)
BEHAVIORAL HEALTH ROUNDING Patient sleeping: No. Patient alert and oriented: asleep Behavior appropriate: Yes.  ; If no, describe:  Nutrition and fluids offered: asleep  Toileting and hygiene offered: asleep Sitter present: yes Law enforcement present: Yes

## 2016-02-20 ENCOUNTER — Encounter: Payer: Self-pay | Admitting: Emergency Medicine

## 2016-02-20 ENCOUNTER — Emergency Department
Admission: EM | Admit: 2016-02-20 | Discharge: 2016-02-21 | Disposition: A | Discharge status: Home / Self Care | Payer: Self-pay | Attending: Emergency Medicine | Admitting: Emergency Medicine

## 2016-02-20 DIAGNOSIS — N839 Noninflammatory disorder of ovary, fallopian tube and broad ligament, unspecified: Secondary | ICD-10-CM | POA: Insufficient documentation

## 2016-02-20 DIAGNOSIS — R52 Pain, unspecified: Secondary | ICD-10-CM

## 2016-02-20 DIAGNOSIS — R197 Diarrhea, unspecified: Secondary | ICD-10-CM | POA: Insufficient documentation

## 2016-02-20 DIAGNOSIS — N390 Urinary tract infection, site not specified: Secondary | ICD-10-CM | POA: Insufficient documentation

## 2016-02-20 DIAGNOSIS — Z79899 Other long term (current) drug therapy: Secondary | ICD-10-CM | POA: Insufficient documentation

## 2016-02-20 DIAGNOSIS — R112 Nausea with vomiting, unspecified: Secondary | ICD-10-CM

## 2016-02-20 DIAGNOSIS — N838 Other noninflammatory disorders of ovary, fallopian tube and broad ligament: Secondary | ICD-10-CM

## 2016-02-20 DIAGNOSIS — I10 Essential (primary) hypertension: Secondary | ICD-10-CM | POA: Insufficient documentation

## 2016-02-20 DIAGNOSIS — J45909 Unspecified asthma, uncomplicated: Secondary | ICD-10-CM | POA: Insufficient documentation

## 2016-02-20 DIAGNOSIS — R1084 Generalized abdominal pain: Secondary | ICD-10-CM

## 2016-02-20 HISTORY — DX: Unspecified osteoarthritis, unspecified site: M19.90

## 2016-02-20 HISTORY — DX: Essential (primary) hypertension: I10

## 2016-02-20 HISTORY — DX: Systemic involvement of connective tissue, unspecified: M35.9

## 2016-02-20 HISTORY — DX: Unspecified asthma, uncomplicated: J45.909

## 2016-02-20 LAB — CBC
HEMATOCRIT: 35.6 % (ref 35.0–47.0)
Hemoglobin: 11 g/dL — ABNORMAL LOW (ref 12.0–16.0)
MCH: 21.5 pg — AB (ref 26.0–34.0)
MCHC: 31 g/dL — ABNORMAL LOW (ref 32.0–36.0)
MCV: 69.5 fL — ABNORMAL LOW (ref 80.0–100.0)
PLATELETS: 554 10*3/uL — AB (ref 150–440)
RBC: 5.12 MIL/uL (ref 3.80–5.20)
RDW: 17.1 % — ABNORMAL HIGH (ref 11.5–14.5)
WBC: 15.1 10*3/uL — AB (ref 3.6–11.0)

## 2016-02-20 LAB — COMPREHENSIVE METABOLIC PANEL
ALT: 17 U/L (ref 14–54)
AST: 26 U/L (ref 15–41)
Albumin: 4.6 g/dL (ref 3.5–5.0)
Alkaline Phosphatase: 104 U/L (ref 38–126)
Anion gap: 10 (ref 5–15)
BUN: 26 mg/dL — ABNORMAL HIGH (ref 6–20)
CHLORIDE: 105 mmol/L (ref 101–111)
CO2: 25 mmol/L (ref 22–32)
CREATININE: 0.78 mg/dL (ref 0.44–1.00)
Calcium: 9.6 mg/dL (ref 8.9–10.3)
GFR calc Af Amer: >60 mL/min (ref >60)
Glucose, Bld: 128 mg/dL — ABNORMAL HIGH (ref 65–99)
POTASSIUM: 4 mmol/L (ref 3.5–5.1)
SODIUM: 140 mmol/L (ref 135–145)
Total Bilirubin: 0.7 mg/dL (ref 0.3–1.2)
Total Protein: 8.3 g/dL — ABNORMAL HIGH (ref 6.5–8.1)

## 2016-02-20 LAB — URINALYSIS, COMPLETE (UACMP) WITH MICROSCOPIC
Bilirubin Urine: NEGATIVE
Glucose, UA: NEGATIVE mg/dL
Hgb urine dipstick: NEGATIVE
KETONES UR: 5 mg/dL — AB
Nitrite: NEGATIVE
Protein, ur: 30 mg/dL — AB
RBC / HPF: NONE SEEN RBC/hpf (ref 0–5)
Specific Gravity, Urine: 1.024 (ref 1.005–1.030)
pH: 5 (ref 5.0–8.0)

## 2016-02-20 LAB — LIPASE, BLOOD: LIPASE: 22 U/L (ref 11–51)

## 2016-02-20 MED ORDER — IOPAMIDOL (ISOVUE-300) INJECTION 61%
30.0000 mL | Freq: Once | INTRAVENOUS | Status: AC | PRN
  Administered 2016-02-20: 30 mL via ORAL

## 2016-02-20 MED ORDER — ONDANSETRON HCL 4 MG/2ML IJ SOLN
4.0000 mg | Freq: Once | INTRAMUSCULAR | Status: AC
  Administered 2016-02-20: 4 mg via INTRAVENOUS
  Filled 2016-02-20: qty 2

## 2016-02-20 MED ORDER — METOCLOPRAMIDE HCL 5 MG/ML IJ SOLN
INTRAMUSCULAR | Status: AC
  Administered 2016-02-20: 10 mg via INTRAVENOUS
  Filled 2016-02-20: qty 2

## 2016-02-20 MED ORDER — ONDANSETRON 4 MG PO TBDP
4.0000 mg | ORAL_TABLET | Freq: Once | ORAL | Status: AC
  Administered 2016-02-20: 4 mg via ORAL
  Filled 2016-02-20: qty 1

## 2016-02-20 MED ORDER — METOCLOPRAMIDE HCL 5 MG/ML IJ SOLN
10.0000 mg | Freq: Once | INTRAMUSCULAR | Status: AC
  Administered 2016-02-20: 10 mg via INTRAVENOUS

## 2016-02-20 MED ORDER — SODIUM CHLORIDE 0.9 % IV BOLUS (SEPSIS)
1000.0000 mL | Freq: Once | INTRAVENOUS | Status: AC
  Administered 2016-02-20: 1000 mL via INTRAVENOUS

## 2016-02-20 NOTE — ED Notes (Signed)
Pt sick on stomach states she cannot tolerate contrast. MD made aware.

## 2016-02-20 NOTE — ED Provider Notes (Signed)
[facility] Emergency Department Provider Note   ____________________________________________   I have reviewed the triage vital signs and the nursing notes.   HISTORY  Chief Complaint Abdominal Pain   History limited by: Language [facility] Interpreter utilized   HPI Maria Griffin is a 56 y.o. female who presents to the emergency department today because of concerns for nausea vomiting and diarrhea. The symptoms started today. It started with nausea and vomiting. The patient has had multiple episodes of vomiting. Soon after the vomiting started she also developed watery diarrhea. Says been accompanied by abdominal pain. Patient states that she tends to get stomach issues and they will become severe.  Past Medical History:  Diagnosis Date  . Arthritis   . Asthma   . Hypertension     Patient Active Problem List   Diagnosis Date Noted  . Depression 10/07/2014  . Arthritis 10/07/2014    History reviewed. No pertinent surgical history.  Prior to Admission medications   Medication Sig Start Date End Date Taking? Authorizing Provider  albuterol (PROVENTIL HFA;VENTOLIN HFA) 108 (90 BASE) MCG/ACT inhaler Inhale 2 puffs into the lungs every 4 (four) hours as needed for wheezing or shortness of breath.    Historical Provider, MD  albuterol (PROVENTIL) (2.5 MG/3ML) 0.083% nebulizer solution Take 2.5 mg by nebulization every 4 (four) hours as needed for wheezing or shortness of breath.    Historical Provider, MD  Calcium Carbonate-Vitamin D (CALCIUM 600+D) 600-400 MG-UNIT per tablet Take 1 tablet by mouth 2 (two) times daily.    Historical Provider, MD  etanercept (ENBREL) 50 MG/ML injection Inject 50 mg into the skin once a week.    Historical Provider, MD  Fluticasone-Salmeterol (ADVAIR) 500-50 MCG/DOSE AEPB Inhale 1 puff into the lungs 2 (two) times daily.    Historical Provider, MD  hydroxychloroquine (PLAQUENIL) 200 MG tablet Take 200 mg by mouth  2 (two) times daily.    Historical Provider, MD  leflunomide (ARAVA) 20 MG tablet Take 20 mg by mouth daily.    Historical Provider, MD  montelukast (SINGULAIR) 10 MG tablet Take 10 mg by mouth at bedtime.    Historical Provider, MD  predniSONE (DELTASONE) 5 MG tablet Take 2 tablets (10 mg total) by mouth daily. 10/07/14   Jene Every, MD    Allergies Codeine and Morphine and related  History reviewed. No pertinent family history.  Social History Social History  Substance Use Topics  . Smoking status: Never Smoker  . Smokeless tobacco: Never Used  . Alcohol use No    Review of Systems  Constitutional: Negative for fever. Cardiovascular: Negative for chest pain. Respiratory: Negative for shortness of breath. Gastrointestinal: Positive for abdominal pain, nausea, vomiting and diarrhea.  Genitourinary: Negative for dysuria. Musculoskeletal: Negative for back pain. Skin: Negative for rash. Neurological: Negative for headaches, focal weakness or numbness.  10-point ROS otherwise negative.  ____________________________________________   PHYSICAL EXAM:  VITAL SIGNS: ED Triage Vitals  Enc Vitals Group     BP 02/20/16 2109 139/68     Pulse Rate 02/20/16 2109 99     Resp 02/20/16 2109 19     Temp 02/20/16 2109 98.3 F (36.8 C)     Temp Source 02/20/16 2109 Oral     SpO2 02/20/16 2109 100 %     Weight 02/20/16 2110 148 lb (67.1 kg)     Height 02/20/16 2110 5\' 1"  (1.549 m)     Head Circumference --      Peak Flow --  Pain Score 02/20/16 2112 7   Constitutional: Alert and oriented. Appears slightly uncomfortable.  Eyes: Conjunctivae are normal. Normal extraocular movements. ENT   Head: Normocephalic and atraumatic.   Nose: No congestion/rhinnorhea.   Mouth/Throat: Mucous membranes are moist.   Neck: No stridor. Hematological/Lymphatic/Immunilogical: No cervical lymphadenopathy. Cardiovascular: Normal rate, regular rhythm.  No murmurs, rubs, or gallops.   Respiratory: Normal respiratory effort without tachypnea nor retractions. Breath sounds are clear and equal bilaterally. No wheezes/rales/rhonchi. Gastrointestinal: Soft. Tender to palpation in the right lower quadrant.  Genitourinary: Deferred Musculoskeletal: Normal range of motion in all extremities. No lower extremity edema. Neurologic:  Normal speech and language. No gross focal neurologic deficits are appreciated.  Skin:  Skin is warm, dry and intact. No rash noted. Psychiatric: Mood and affect are normal. Speech and behavior are normal. Patient exhibits appropriate insight and judgment.  ____________________________________________    LABS (pertinent positives/negatives)  Labs Reviewed  COMPREHENSIVE METABOLIC PANEL - Abnormal; Notable for the following:       Result Value   Glucose, Bld 128 (*)    BUN 26 (*)    Total Protein 8.3 (*)    All other components within normal limits  CBC - Abnormal; Notable for the following:    WBC 15.1 (*)    Hemoglobin 11.0 (*)    MCV 69.5 (*)    MCH 21.5 (*)    MCHC 31.0 (*)    RDW 17.1 (*)    Platelets 554 (*)    All other components within normal limits  URINALYSIS, COMPLETE (UACMP) WITH MICROSCOPIC - Abnormal; Notable for the following:    Color, Urine AMBER (*)    APPearance HAZY (*)    Ketones, ur 5 (*)    Protein, ur 30 (*)    Leukocytes, UA SMALL (*)    Bacteria, UA RARE (*)    Squamous Epithelial / LPF 6-30 (*)    All other components within normal limits  LIPASE, BLOOD     ____________________________________________   EKG  None  ____________________________________________    RADIOLOGY  CT abd.pel pending  ____________________________________________   PROCEDURES  Procedures  ____________________________________________   INITIAL IMPRESSION / ASSESSMENT AND PLAN / ED COURSE  Pertinent labs & imaging results that were available during my care of the patient were reviewed by me and considered in my  medical decision making (see chart for details).  Patient presents the emergency department today because of concerns for nausea vomiting and abdominal pain. On exam she is tender in the right lower quadrant. Patient did have leukocytosis on blood work. Patient still has her appendix. Given concern for possible appendicitis will proceed with CT abdomen and pelvis.  ____________________________________________   FINAL CLINICAL IMPRESSION(S) / ED DIAGNOSES  Abdominal pain  Note: This dictation was prepared with Dragon dictation. Any transcriptional errors that result from this process are unintentional     Phineas SemenGraydon Tarica Harl, MD 02/20/16 [phone]

## 2016-02-20 NOTE — ED Triage Notes (Signed)
Pt presents to ED with c/o abdominal cramps, associated with nausea and vomiting since 1700 this evening.

## 2016-02-21 ENCOUNTER — Emergency Department: Payer: Self-pay

## 2016-02-21 ENCOUNTER — Encounter: Payer: Self-pay | Admitting: Radiology

## 2016-02-21 MED ORDER — ONDANSETRON 4 MG PO TBDP: 4.0000 mg | ORAL_TABLET | Freq: Three times a day (TID) | ORAL | 0 refills | Status: AC | PRN

## 2016-02-21 MED ORDER — CEPHALEXIN 500 MG PO CAPS
500.0000 mg | ORAL_CAPSULE | Freq: Once | ORAL | Status: AC
  Administered 2016-02-21: 500 mg via ORAL
  Filled 2016-02-21: qty 1

## 2016-02-21 MED ORDER — CEPHALEXIN 500 MG PO CAPS: 500.0000 mg | ORAL_CAPSULE | Freq: Three times a day (TID) | ORAL | 0 refills | Status: AC

## 2016-02-21 MED ORDER — IOPAMIDOL (ISOVUE-370) INJECTION 76%
75.0000 mL | Freq: Once | INTRAVENOUS | Status: AC | PRN
  Administered 2016-02-21: 75 mL via INTRAVENOUS

## 2016-02-21 NOTE — ED Notes (Signed)
Pt at ultrasound

## 2016-02-21 NOTE — ED Provider Notes (Signed)
-----------------------------------------   1:11 AM on 02/21/2016 -----------------------------------------  CT abdomen and pelvis with contrast interpreted per Dr. Clovis RileyMitchell:  Right adnexal mass measuring up to 17 cm with solid and cystic  components. 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 Smaller left  ovarian mass. No ascites, adenopathy or omental lesion. Pelvic  ultrasound and pelvic MRI may be helpful for better  characterization.   Updated patient and her daughter of CT imaging results concerning for right adnexal mass. Will proceed with pelvic ultrasound.  ----------------------------------------- 3:07 AM on 02/21/2016 -----------------------------------------  Pelvic ultrasound interpreted per Dr. Jake SamplesFujinaga:  1. 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 Surgical consultation is recommended.  2. Complex left ovarian/adnexal mass. Surgical consultation is  recommended.   Updated patient and her daughter on ultrasound results. She will follow up closely with oncology. She tolerated oral contrast without emesis and is improved from gastroenteritis standpoint. Urinalysis does show small leukocytes with 6-30 WBC; will place on Keflex. Strict return precautions given. Both verbalize understanding and agree with plan of care.   Irean HongJade J Sung, MD 02/21/16 [id number]

## 2016-02-21 NOTE — Discharge Instructions (Signed)
1. Take antibiotic as prescribed (Keflex 500 mg 3 times daily 5 days). 2. You may take Zofran as needed for nausea. 3. Liquids 12 hours, then BRAT diet 3 days. Then you may slowly advance diet as tolerated. 4. Your CT and ultrasound results are concerning for ovarian mass. Please call the number provided and schedule a follow-up appointment with the gynecologist. 5. Return to the ER for worsening symptoms, persistent vomiting, difficulty breathing or other concerns.

## 2016-09-06 MED ORDER — HYDROXYCHLOROQUINE 200 MG TABLET
ORAL_TABLET | Freq: Two times a day (BID) | ORAL | 11 refills | 0 days | Status: CP
Start: 2016-09-06 — End: 2017-11-06

## 2017-01-02 ENCOUNTER — Ambulatory Visit
Admission: RE | Admit: 2017-01-02 | Discharge: 2017-01-02 | Disposition: A | Discharge status: Home / Self Care | Attending: Rheumatology | Admitting: Rheumatology

## 2017-01-02 DIAGNOSIS — M069 Rheumatoid arthritis, unspecified: Principal | ICD-10-CM

## 2017-01-21 MED ORDER — LEFLUNOMIDE 20 MG TABLET
ORAL_TABLET | Freq: Every day | ORAL | 3 refills | 0 days | Status: CP
Start: 2017-01-21 — End: 2017-05-22

## 2017-01-21 MED ORDER — PREDNISONE 5 MG TABLET
ORAL_TABLET | Freq: Every day | ORAL | 3 refills | 0.00000 days | Status: CP
Start: 2017-01-21 — End: 2017-05-22

## 2017-02-26 ENCOUNTER — Encounter (INDEPENDENT_AMBULATORY_CARE_PROVIDER_SITE_OTHER): Payer: Self-pay

## 2017-02-26 ENCOUNTER — Ambulatory Visit
Admission: RE | Admit: 2017-02-26 | Discharge: 2017-02-26 | Disposition: A | Discharge status: Home / Self Care | Payer: Self-pay | Source: Ambulatory Visit | Attending: Oncology | Admitting: Oncology

## 2017-02-26 ENCOUNTER — Ambulatory Visit: Payer: Self-pay | Attending: Oncology

## 2017-02-26 VITALS — BP 172/75 | HR 97 | Temp 99.7°F | Resp 18 | Ht 61.0 in | Wt 157.0 lb

## 2017-02-26 DIAGNOSIS — Z0000 Encounter for general adult medical examination without abnormal findings: Secondary | ICD-10-CM

## 2017-02-26 NOTE — Progress Notes (Signed)
Subjective:     Patient ID: Maria Griffin, female   DOB: 12/25/1960, 57 y.o.   MRN: [id number]  HPI   Review of Systems     Objective:   Physical Exam  Pulmonary/Chest: Right breast exhibits no inverted nipple, no mass, no nipple discharge, no skin change and no tenderness. Left breast exhibits no inverted nipple, no mass, no nipple discharge, no skin change and no tenderness. Breasts are symmetrical.       Assessment:     57 year old hispanic patient presents for BCCCP clinic visit.  Patient screened, and meets BCCCP eligibility.  Patient does not have insurance, Medicare or Medicaid.  Handout given on Affordable Care Act.  Patient has 2 sisters who have been diagnosed with,and treated for breast cancer, and one sister who passed away with ovarian cancer.  Patient[place] previous mammogram was in [place] 4 years ago.  She states she has information on that mammogram at home.   Instructed patient on breast self-exam using teach back method.  CBE unremarkable.  No mass or lump palpated.       Plan:     Sent for bilateral screening mammogram.

## 2017-03-05 NOTE — Progress Notes (Signed)
Letter mailed from [facility] to notify of normal mammogram results.  Patient to return in one year for annual screening.  Copy to [facility]. 

## 2017-05-22 MED ORDER — LEFLUNOMIDE 20 MG TABLET
ORAL_TABLET | Freq: Every day | ORAL | 3 refills | 0.00000 days | Status: CP
Start: 2017-05-22 — End: 2017-05-28

## 2017-05-22 MED ORDER — PREDNISONE 5 MG TABLET
ORAL_TABLET | Freq: Every day | ORAL | 3 refills | 0 days | Status: CP
Start: 2017-05-22 — End: 2017-05-28

## 2017-05-28 ENCOUNTER — Ambulatory Visit: Admit: 2017-05-28 | Discharge: 2017-05-28

## 2017-05-28 DIAGNOSIS — M059 Rheumatoid arthritis with rheumatoid factor, unspecified: Principal | ICD-10-CM

## 2017-05-28 DIAGNOSIS — Z5181 Encounter for therapeutic drug level monitoring: Secondary | ICD-10-CM

## 2017-05-28 DIAGNOSIS — Z7952 Long term (current) use of systemic steroids: Secondary | ICD-10-CM

## 2017-05-28 DIAGNOSIS — G8929 Other chronic pain: Secondary | ICD-10-CM

## 2017-05-28 DIAGNOSIS — Z598 Other problems related to housing and economic circumstances: Secondary | ICD-10-CM

## 2017-05-28 DIAGNOSIS — Z79899 Other long term (current) drug therapy: Secondary | ICD-10-CM

## 2017-05-28 DIAGNOSIS — M5442 Lumbago with sciatica, left side: Secondary | ICD-10-CM

## 2017-05-28 MED ORDER — GOLIMUMAB 50 MG/0.5 ML SUBCUTANEOUS PEN INJECTOR: mL | 9 refills | 0 days

## 2017-05-28 MED ORDER — PREDNISONE 5 MG TABLET
ORAL_TABLET | Freq: Every day | ORAL | 3 refills | 0.00000 days | Status: CP
Start: 2017-05-28 — End: 2017-11-27

## 2017-05-28 MED ORDER — LEFLUNOMIDE 20 MG TABLET
ORAL_TABLET | Freq: Every day | ORAL | 3 refills | 0.00000 days | Status: CP
Start: 2017-05-28 — End: 2017-11-06

## 2017-05-28 MED ORDER — GOLIMUMAB 50 MG/0.5 ML SUBCUTANEOUS PEN INJECTOR
SUBCUTANEOUS | 9 refills | 0.00000 days | Status: CP
Start: 2017-05-28 — End: 2017-11-27

## 2017-05-28 MED ORDER — GOLIMUMAB 50 MG/0.5 ML SUBCUTANEOUS PEN INJECTOR: 50 mg | mL | 3 refills | 0 days | Status: AC

## 2017-06-06 ENCOUNTER — Ambulatory Visit: Admit: 2017-06-06 | Discharge: 2017-06-06

## 2017-06-06 DIAGNOSIS — Z7952 Long term (current) use of systemic steroids: Principal | ICD-10-CM

## 2017-07-11 NOTE — Unmapped (Signed)
[facility] Specialty Medication Referral: No PA required    Medication (Brand/Generic): Simponi    Initial FSI Test Claim completed with resulted information below:  No PA required  Patient ABLE to fill at [facility]  Insurance Company:  [employer] [place] and [place] copay card  Anticipated Copay: $0    As Co-pay is under $100 defined limit, per policy there will be no further investigation of need for financial assistance at this time unless patient requests. This referral has been communicated to the provider and handed off to the [facility] team for further processing and filling of prescribed medication.   ______________________________________________________________________  Please utilize this referral for viewing purposes as it will serve as the central location for all relevant documentation and updates.

## 2017-07-15 MED ORDER — EMPTY CONTAINER
Freq: Once | 3 refills | 0 days | Status: CP | PRN
Start: 2017-07-15 — End: ?

## 2017-07-15 NOTE — Unmapped (Signed)
[facility] Specialty Pharmacy - Pharmacist Onboarding Note    Specialty Medication: Simponi     Simponi is approved through the mfg assistance and ready for shipment through [facility] via pharmacy card.  Reached out to onboard patient. Left message requesting call back.     First attempt (07/15/17)  Second attempt (07/17/17)    Chelsea Aus

## 2017-07-17 MED FILL — SHARPS KIT/NA/MISC: SHARPS KIT/NA/MISC | 120 days supply | Qty: 1 | Fill #0

## 2017-07-17 MED FILL — SIMPONI/50MG/SOLN: SIMPONI/50MG/SOLN | 28 days supply | Qty: 1 | Fill #0

## 2017-07-17 NOTE — Unmapped (Signed)
[facility] Specialty Pharmacy - Pharmacist Onboarding Note    Specialty Medication: Sara Wagner, DOB: April 23, 1960  Above HIPAA information was verified with patient's daughter, Sara Wagner is a 57 y.o. female being initiated on Simponi for rheumatoid arthritis.  Medication list, allergies and comorbidities reviewed:  appropriate to initiate therapy.       Regimen & Administration: Simponi 50 mg subq once every 28 days .    Administer without regards to meal.  If a dose is missed, administer as soon as it is remembered and restart administration cycle    Storage/Handling/Disposal:  Refridgerated.  Patient will dispose of needles in a sharps container or empty laundry detergent bottle.     Drug-Drug & Drug-Food Interactions:  None noted    Side-effects:    ?? Discussed the risk of injection site reactions, headache, skin rash, infections, abnormalities in blood counts, and malignancy.   ?? In the case of signs of infections including fever, discomfort when urinating, sinus congestion, or other complaints, patient should hold the next dose of Simponi and call clinic to ensure adequate medical care  ?? In the case where a planned procedure is scheduled or live vaccines are indicated, patient should contact the clinic for further recommendations  ?? Counseled on signs of a significant drug reaction (wheezing, chest tightness, fever, itching, cough, blue skin color, seizures, swelling of face, lips, tongue or throat, etc)    Injection Training:   ?? Reviewed injection techniques and patient felt comfortable with self-administration at home.  Injection site-reactions discussed.    Pharmacy Information:    ?? Patient will be receiving medication from the [facility] [phone], option 4).  A representative from the pharmacy will contact the patient to set up deliveries 7-10 days prior to their subsequent needed refill.  The pharmacy must speak to the patient to schedule the refill. Advised patient to answer phone calls from the pharmacy to prevent delays in therapy.    ?? Patient will receive a medication information handout as well as a welcome packet from the pharmacy.    ?? The pharmacy is open Monday - Friday 8:30am-4:30pm.  A pharmacist is available 24/7 via pager to answer any clinical questions.    ?? Patient will receive medication through common mail carrier (UPS).  ?? Anticipated co-pay:  $0 for a 1 month supply though mfg assistance pharmacy card   ?? Medication assistance provided: mfg assistance     Emphasized the importance of adherence to prescribed regimen, clinic follow-up visits, and laboratory testing.      SHIPPING     ?? Shipping address verified in FSI.  Expected medication delivery date: 07/18/17, via UPS.  Patient plans to start therapy on 07/19/17  ?? Other medications/items to be shipping: sharps container    Patient specific needs were assessed and addressed:  language differences, literacy level, cultural barriers, cognitive and/or physical impairments.      All questions were answered and contact information provided for any future questions/concerns.      Jeneen Montgomery

## 2017-07-21 ENCOUNTER — Ambulatory Visit: Admit: 2017-07-21 | Discharge: 2017-07-22

## 2017-07-21 DIAGNOSIS — H524 Presbyopia: Secondary | ICD-10-CM

## 2017-07-21 DIAGNOSIS — Z79899 Other long term (current) drug therapy: Secondary | ICD-10-CM

## 2017-07-21 DIAGNOSIS — M059 Rheumatoid arthritis with rheumatoid factor, unspecified: Principal | ICD-10-CM

## 2017-07-21 NOTE — Unmapped (Signed)
Nalleli Largent is a 57 y.o. woman seen today in consultation for rheumatoid arthritis at the request of Staci Righter, *     Assessment and Plan:  1) Rheumatoid Arthritis on plaquenil  -- started plaquenil 8 years ago    No evidence of plaquenil toxicity    Duration of use: 8 years,  200mg   twice daily PO. This is equivalent to 5.6 mg/kg/day.    Risk factors: No liver disease. No renal dysfunction. No history of use of tamoxifen    I reviewed the patient's current tests to see if there were signs of retinal toxicity.  The tests done provide non-duplicative information to detect signs of retinal toxicity in terms of retinal structure and function.    Clinical findings: Within normal limits     Fundus autofluorescence (FAF):  Within normal limits    Spectral domain optical coherence tomography (SD-OCT): Within normal limits   Humphrey visual field 10-2 :Within normal limits     Overall, my evaluation today indicates that the patient has no retinal toxicity due to plaquenil.            Plan:  I recommend that we see the patient again in 1 year to repeat the evaluation with clinical exam, HVF 10-2, FAF, SD-OCT.  Dosage is currently slightly higher per weight than the recommended AAO dose of 5mg /kg/day, would consider lowering dosage if possible.         2) Presbyopia  -- continue OTC readers          CC: Staci Righter, * (referring)

## 2017-08-06 NOTE — Unmapped (Signed)
Spoke with daughter- she asked how long to leave simponi out before injecting. Told her to take it out of the fridge, out of the box and wait 30 minutes then inject  Told her when she takes it out of the box go ahead and set a timer so you know exactly when it's good to go  Daughter reports mom had chills and joint pain about a week after the injection that lasted a day or so-told her I would let the CPP know just so we had a record of it-her temp at the time was 99.6 so she wasn't too worried but thought she would mention it    [facility] Refill Coordination Note    Specialty Medication(s) to be Shipped:   Inflammatory Disorders: Simponi    Other medication(s) to be shipped: n/a     Sara Wagner, DOB: October 06, 1960  Phone: [phone] (home)   Shipping Address: [address]  [place] [address]    All above HIPAA information was verified with patient's caregiver.     Completed refill call assessment today to schedule patient's medication shipment from the [facility] [phone]).       Specialty medication(s) and dose(s) confirmed: Regimen is correct and unchanged.   Changes to medications: Cathyann reports no changes reported at this time.  Changes to insurance: No  Questions for the pharmacist: No    The patient will receive an FSI print out for each medication shipped and additional FDA Medication Guides as required.  Patient education from [place] or Robet Leu may also be included in the shipment.    DISEASE-SPECIFIC INFORMATION        For Rheumatology patients: Next dose of simponi from this shipment due on 6/28    ADHERENCE     Medication Adherence    Patient reported X missed doses in the last month:  0  Specialty Medication:  simponi   Patient is on additional specialty medications:  No  Patient is on more than two specialty medications:  No  Any gaps in refill history greater than 2 weeks in the last 3 months:  no  Demonstrates understanding of importance of adherence:  yes  Informant:  child/children  Reliability of informant:  reliable  Support network for adherence:  family member  Confirmed plan for next specialty medication refill:  delivery by pharmacy  Refills needed for supportive medications:  not needed          Refill Coordination    Has the Patients' Contact Information Changed:  No  Is the Shipping Address Different:  No         SHIPPING     Shipping address confirmed in FSI.     Delivery Scheduled: Yes, Expected medication delivery date: 08/12/17 via UPS or courier.     [name]   [facility] Shared [facility] Specialty Technician

## 2017-08-11 MED FILL — SIMPONI/50MG/SOLN: SIMPONI/50MG/SOLN | 28 days supply | Qty: 1 | Fill #1

## 2017-08-28 NOTE — Unmapped (Signed)
Patient did not show for their rheumatology appointment today.   I intended to discuss her recent DEXA revealing osteoporosis at her visit today. Will discuss at future appt. Should consider bisphosphonate therapy like reclast.

## 2017-09-08 NOTE — Unmapped (Signed)
Spoke with Sara Wagner's daughter.  She says her mother is doing well on the Simponi so far.   No side effects or any concerns at this time  She's not sure when Sara Wagner is due for her next dose but agreed to delivery this Wednesday the 24th    [facility] Refill Coordination Note    Specialty Medication(s) to be Shipped:   Inflammatory Disorders: Simponi    Other medication(s) to be shipped: n/a     Sara Wagner, DOB: 12-Apr-1960  Phone: [phone] (home)   Shipping Address: [address]  [place] [address]    All above HIPAA information was verified with patient's daughter     Completed refill call assessment today to schedule patient's medication shipment from the [facility] [phone]).       Specialty medication(s) and dose(s) confirmed: Regimen is correct and unchanged.   Changes to medications: Sara Wagner reports no changes reported at this time.  Changes to insurance: No  Questions for the pharmacist: No    The patient will receive an FSI print out for each medication shipped and additional FDA Medication Guides as required.  Patient education from [place] or Robet Leu may also be included in the shipment.    DISEASE/MEDICATION-SPECIFIC INFORMATION        For Rheumatology patients: Next dose of simponi from this shipment due on daughter wasn't sure but based on my last note it should be this friday 7/26    ADHERENCE     Medication Adherence    Patient reported X missed doses in the last month:  0  Specialty Medication:  SIMPONI  Patient is on additional specialty medications:  No  Patient is on more than two specialty medications:  No  Any gaps in refill history greater than 2 weeks in the last 3 months:  no  Demonstrates understanding of importance of adherence:  yes  Informant:  child/children  Reliability of informant:  reliable  Support network for adherence:  family member  Confirmed plan for next specialty medication refill:  delivery by pharmacy  Refills needed for supportive medications:  not needed          Refill Coordination    Has the Patients' Contact Information Changed:  No  Is the Shipping Address Different:  No         SHIPPING     Shipping address confirmed in FSI.     Delivery Scheduled: Yes, Expected medication delivery date: 7/24 via UPS or courier.     [name]   [facility] Shared [facility] Specialty Technician

## 2017-09-09 MED FILL — SIMPONI/50MG/SOLN: SIMPONI/50MG/SOLN | 28 days supply | Qty: 1 | Fill #2

## 2017-10-10 NOTE — Unmapped (Signed)
[facility] Specialty Pharmacy Refill Coordination Note  Medication: SIMPONI    Unable to reach patient to schedule shipment for medication being filled at [facility]. Left voicemail on phone.  As this is the 3rd unsuccessful attempt to reach the patient, no additional phone call attempts will be made at this time.      Phone numbers attempted: [phone] (was able to leave a message once, next time it said number unreachable), [phone]  Last scheduled delivery: 09/09/17    Please call the [facility] at [phone] (option 4) should you have any further questions.      Thanks,  [facility] Shared [facility] Specialty Team

## 2017-10-15 NOTE — Unmapped (Signed)
Hi [name],    Patient is due for labs before we can refill this. Please relay to patient to go their nearest [facility] lab to have this done. I've entered in the orders.     Thank you!    Sara Wagner

## 2017-10-22 NOTE — Unmapped (Signed)
[facility]: Rheumatology Clinic Assessment and Refill Call    Specialty Medication(s): Simponi  Indication(s): rheumatoid arthritis     Cordelia Poche, DOB: 1960/09/12  Above HIPAA information was verified with patient's family member.      Medications reviewed & verified: Allergies - Medications -      Specialty medication(s) & dose(s) confirmed: yes  Changes to medications: no  Tolerating medications:   Adverse Effects    Fatigue:  Pos        Amount of medications patient has on hand:  none    CLINICAL ASSESSMENT     Daughter, Raynelle Fanning, reports tolerating Simponi.  Recently she has been more fatigue but unsure if it's due to medications or other factors.  She also is having flares of her asthma.  Adherence to therapy confirmed with patient and refill record @ [facility].  Daughter reports zero missed doses but per last refill history of 09/09/17, patient is at least a couple of weeks behind in her Simponi dose.  Advised patient and daughter to resume therapy as soon as possible and contact the pharmacy if they are out of medication.  Pharmacy attempted to call patient the past few weeks without success.  Clinically, RA is better, some stiffness in the morning..  As therapy is effective, appropriate to continue at this time.      Does Edia have follow up appointment scheduled with clinic? Yes, appointment is scheduled and patient is aware    SHIPPING     ?? Shipping address verified in FSI.  Expected medication delivery date: 10/24/17, via UPS.  Next dose of Simponi from this shipment due on the same day.  ?? Other medications/items to be shipping:  n/a    The patient will receive an [facility]SI print out for each medication shipped and additional FDA Medication Guides as required.  Patient education from [place] or Robet Leu may also be included in the shipment    All questions were answered and contact information provided for any future questions/concerns.      Jeneen Montgomery

## 2017-10-24 MED FILL — SIMPONI 50 MG/0.5 ML SUBCUTANEOUS PEN INJECTOR: 28 days supply | Qty: 0.5 | Fill #0

## 2017-10-24 MED FILL — SIMPONI 50 MG/0.5 ML SUBCUTANEOUS PEN INJECTOR: 28 days supply | Qty: 0 | Fill #0 | Status: AC

## 2017-11-06 NOTE — Unmapped (Signed)
HCQ and leflunomide refills  Last ov and labs 05/28/17  Next ov 11/27/17

## 2017-11-07 MED ORDER — HYDROXYCHLOROQUINE 200 MG TABLET
ORAL_TABLET | Freq: Two times a day (BID) | ORAL | 11 refills | 0.00000 days | Status: CP
Start: 2017-11-07 — End: 2017-11-27

## 2017-11-07 MED ORDER — LEFLUNOMIDE 20 MG TABLET
ORAL_TABLET | Freq: Every day | ORAL | 3 refills | 0.00000 days | Status: CP
Start: 2017-11-07 — End: 2017-11-27

## 2017-11-18 NOTE — Unmapped (Signed)
Spoke with Byrd Hesselbach today- she did not need an interpretor on this call- I spoke slowly and she was able to follow along    She is doing good on the simponi.    She wasn't sure when her next dose was due  Sending this out to her Thursday as it looks like she will be due this Friday 10/4    [facility] Refill Coordination Note    Specialty Medication(s) to be Shipped:   Inflammatory Disorders: Simponi    Other medication(s) to be shipped: n/a     Cordelia Poche, DOB: 1960-08-23  Phone: [phone] (home)   Shipping Address: [address]  [place] [address]    All above HIPAA information was verified with patient.     Completed refill call assessment today to schedule patient's medication shipment from the [facility] [phone]).       Specialty medication(s) and dose(s) confirmed: Regimen is correct and unchanged.   Changes to medications: Minsa reports no changes reported at this time.  Changes to insurance: No  Questions for the pharmacist: No    The patient will receive a drug information handout for each medication shipped and additional FDA Medication Guides as required.      DISEASE/MEDICATION-SPECIFIC INFORMATION        For Rheumatology patients: Next dose of simponi from this shipment due on 10/4    ADHERENCE     Medication Adherence    Patient reported X missed doses in the last month:  0  Specialty Medication:  simponi  Patient is on additional specialty medications:  No  Patient is on more than two specialty medications:  No  Any gaps in refill history greater than 2 weeks in the last 3 months:  no  Informant:  patient  Support network for adherence:  family member  Confirmed plan for next specialty medication refill:  delivery by pharmacy  Refills needed for supportive medications:  not needed          Refill Coordination    Has the Patients' Contact Information Changed:  No  Is the Shipping Address Different:  No           SHIPPING     Shipping address confirmed in Epic.     Delivery Scheduled: Yes, Expected medication delivery date: 10/3 via UPS or courier.     [name]   [facility] Specialty Technician

## 2017-11-20 MED FILL — SIMPONI 50 MG/0.5 ML SUBCUTANEOUS PEN INJECTOR: 28 days supply | Qty: 0 | Fill #1 | Status: AC

## 2017-11-20 MED FILL — SIMPONI 50 MG/0.5 ML SUBCUTANEOUS PEN INJECTOR: 28 days supply | Qty: 0.5 | Fill #1

## 2017-11-27 ENCOUNTER — Ambulatory Visit: Admit: 2017-11-27 | Discharge: 2017-11-28 | Attending: Rheumatology | Primary: Rheumatology

## 2017-11-27 DIAGNOSIS — M059 Rheumatoid arthritis with rheumatoid factor, unspecified: Principal | ICD-10-CM

## 2017-11-27 DIAGNOSIS — Z79899 Other long term (current) drug therapy: Secondary | ICD-10-CM

## 2017-11-27 DIAGNOSIS — M810 Age-related osteoporosis without current pathological fracture: Secondary | ICD-10-CM

## 2017-11-27 DIAGNOSIS — Z5181 Encounter for therapeutic drug level monitoring: Secondary | ICD-10-CM

## 2017-11-27 DIAGNOSIS — M5432 Sciatica, left side: Secondary | ICD-10-CM

## 2017-11-27 LAB — CBC W/ AUTO DIFF
BASOPHILS ABSOLUTE COUNT: 0.1 10*9/L (ref 0.0–0.1)
BASOPHILS RELATIVE PERCENT: 1.4 %
EOSINOPHILS RELATIVE PERCENT: 5 %
HEMATOCRIT: 39.4 % (ref 36.0–46.0)
LARGE UNSTAINED CELLS: 3 % (ref 0–4)
LYMPHOCYTES ABSOLUTE COUNT: 1.1 10*9/L — ABNORMAL LOW (ref 1.5–5.0)
LYMPHOCYTES RELATIVE PERCENT: 11.8 %
MEAN CORPUSCULAR HEMOGLOBIN: 25.5 pg — ABNORMAL LOW (ref 26.0–34.0)
MEAN CORPUSCULAR VOLUME: 82.1 fL (ref 80.0–100.0)
MEAN PLATELET VOLUME: 8.3 fL (ref 7.0–10.0)
MONOCYTES ABSOLUTE COUNT: 0.5 10*9/L (ref 0.2–0.8)
MONOCYTES RELATIVE PERCENT: 5 %
NEUTROPHILS ABSOLUTE COUNT: 7 10*9/L (ref 2.0–7.5)
NEUTROPHILS RELATIVE PERCENT: 74.4 %
PLATELET COUNT: 348 10*9/L (ref 150–440)
RED BLOOD CELL COUNT: 4.8 10*12/L (ref 4.00–5.20)
RED CELL DISTRIBUTION WIDTH: 14.4 % (ref 12.0–15.0)
WBC ADJUSTED: 9.4 10*9/L (ref 4.5–11.0)

## 2017-11-27 LAB — ALT (SGPT): Alanine aminotransferase:CCnc:Pt:Ser/Plas:Qn:: 21

## 2017-11-27 LAB — CREATININE: EGFR CKD-EPI AA FEMALE: >90 mL/min/{1.73_m2} (ref >=60)

## 2017-11-27 LAB — BASOPHILS RELATIVE PERCENT: Lab: 1.4

## 2017-11-27 LAB — AST (SGOT): Aspartate aminotransferase:CCnc:Pt:Ser/Plas:Qn:: 27

## 2017-11-27 LAB — EGFR CKD-EPI AA FEMALE: Lab: >90

## 2017-11-27 MED ORDER — LEFLUNOMIDE 20 MG TABLET
ORAL_TABLET | Freq: Every day | ORAL | 3 refills | 0 days | Status: CP
Start: 2017-11-27 — End: 2018-10-20

## 2017-11-27 MED ORDER — CYCLOBENZAPRINE 5 MG TABLET
ORAL_TABLET | 3 refills | 0 days | Status: CP
Start: 2017-11-27 — End: ?

## 2017-11-27 MED ORDER — PREDNISONE 5 MG TABLET
ORAL_TABLET | Freq: Every day | ORAL | 3 refills | 0 days | Status: CP
Start: 2017-11-27 — End: 2018-04-16

## 2017-11-27 MED ORDER — HYDROXYCHLOROQUINE 200 MG TABLET
ORAL_TABLET | Freq: Two times a day (BID) | ORAL | 11 refills | 0 days | Status: CP
Start: 2017-11-27 — End: ?

## 2017-11-27 MED ORDER — GOLIMUMAB 50 MG/0.5 ML SUBCUTANEOUS PEN INJECTOR
9 refills | 0 days | Status: CP
Start: 2017-11-27 — End: 2018-11-27

## 2017-11-27 NOTE — Unmapped (Signed)
Chief Complaint: Left sided neck, and low back pain with radiation down the left leg.     HPI:    The patient is a 57 y.o.female with seropositive (+RF and CCP), erosive RA who presents for follow up. She has been getting labs with the PCP. She also has osteoporosis.     Previous RA therapies included: Sq MTX ( severe nausea and GI upset), Etanercept ( helped but was too expensive), adalimumab ( was ineffective) and golimumab ( s/p 4 injections), HCQ, LEF and prednisone.     She was last seen here in June of 2019 and, at the time, we diagnosed her with left sided sciatica and recommended PT and was supposed to have it ordered by her PCP however, this did not happen per her report.     Today she reports left sided neck pain, left sided low back pain with radiation down the left leg with numbness in the left side. No GU dysfunction symptoms.     Endorses persistent swelling of the hands, wrists and knees with associated 3 hours of AM stiffness.     Denies recent fever, chills, fatigue, weight loss, LAD, skin rashes, oral or nasal ulcers, sore throat, alopecia, Raynaud's phenomenon, malar rash, sicca symptoms, photosensitivity, cough, chest pain, dyspnea, leg edema, hemoptysis, nausea, vomiting, diarrhea, abdominal pain, BRBPR, melena, paresthesias, myalgias, muscle weakness, dysphagia, headaches, dizziness,  ocular pain, ocular erythema, visual loss, diplopia, scalp tenderness, jaw claudication or hearing loss.     ROS:  all systems were reviewed and negative except as noted in the HPI.    A EXAM: QDR BODY NON-EXTREMITIES  DATE: 06/06/2017 8:54 AM  ACCESSION: [id number]  DICTATED: 06/06/2017 11:42 AM  INTERPRETATION LOCATION: [employer]    CLINICAL INDICATION: 57 years old Female with postmenopausal estrogen deficiency     TECHNIQUE: Dual energy x-ray absorptiometry was performed assessing the bone mineral density in the lumbar spine and proximal left femur using a Hologic 4500 C densitometer.    FINDINGS: The bone mineral density in the spine measuring L1 to 4 measures 0.743 gm/cm2. ??The ??Z score is -1.6 and the T score is -2.8. ??This value is below the fracture risk threshold.    The total bone mineral density in the proximal left femur measures 0.919 gm/cm2. ??The Z score is 0.5 and the T score is -0.3. ??This value is above the fracture risk threshold. ??The femoral neck density is 0.719 gm/cm2, and the T score is -1.3. ??The other T scores range from -0.7 to -0.1.       Impression     Lumbar spine: Osteoporosis  Left proximal femur: The femoral neck density indicates osteopenia, but the total femoral density is normal.         Past Medical History   Diagnosis Date   ??? Rheumatoid arthritis(714.0)    ??? Asthma, extrinsic  anemia      Immunization History   Administered Date(s) Administered   ??? Influenza Vaccine Quad (IIV4 PF) 59mo+ injectable 01/02/2017   ??? Influenza Virus Vaccine, unspecified formulation 04/07/2013   ??? PNEUMOCOCCAL POLYSACCHARIDE 23 01/02/2017   ??? Pneumococcal Conjugate 13-Valent 06/09/2015     MEDICATIONS:    Current Outpatient Medications:   ???  acetaminophen (TYLENOL) 325 MG tablet, Take 2 tablets (650 mg total) by mouth Every six (6) hours., Disp: 30 tablet, Rfl: 1  ???  albuterol (PROVENTIL HFA;VENTOLIN HFA) 90 mcg/actuation inhaler, Inhale 2 puffs every four (4) hours as needed for wheezing., Disp: , Rfl:   ???  albuterol 2.5 mg /3 mL (0.083 %) nebulizer solution, Inhale 2.5 mg by nebulization every four (4) hours as needed for wheezing., Disp: , Rfl:   ???  amLODIPine (NORVASC) 10 MG tablet, Take 10 mg by mouth daily., Disp: , Rfl:   ???  calcium carbonate-vitamin D3 600 mg(1,500mg ) -400 unit Chew, Chew 1 capsule Two (2) times a day (at 8am and 12:00)., Disp: 1 tablet, Rfl: 1  ???  empty container (BD SHARPS COLLECTOR) Misc, 1 each by Miscellaneous route once as needed. for up to 1 dose, Disp: 1 each, Rfl: 3  ???  fluticasone-salmeterol (ADVAIR) 500-50 mcg/dose diskus, Inhale 1 puff Two (2) times a day., Disp: 1 each, Rfl: 6  ???  golimumab (SIMPONI) 50 mg/0.5 mL PnIj pen injector, INJECT THE CONTENTS OF 1 AUTOINJECTOR (50MG ) UNDER THE SKIN EVERY 28 DAYS, Disp: 1 mL, Rfl: 9  ???  hydroxychloroquine (PLAQUENIL) 200 mg tablet, Take 1 tablet (200 mg total) by mouth Two (2) times a day., Disp: 60 tablet, Rfl: 11  ???  ibuprofen (ADVIL,MOTRIN) 600 MG tablet, Take 1 tablet (600 mg total) by mouth every six (6) hours as needed for pain., Disp: 30 tablet, Rfl: 1  ???  leflunomide (ARAVA) 20 MG tablet, Take 1 tablet (20 mg total) by mouth daily., Disp: 30 tablet, Rfl: 3  ???  lisinopril (PRINIVIL,ZESTRIL) 20 MG tablet, Take 40 mg by mouth daily. , Disp: , Rfl:   ???  montelukast (SINGULAIR) 10 mg tablet, Take 1 tablet (10 mg total) by mouth nightly., Disp: 30 tablet, Rfl: 8  ???  omeprazole (PRILOSEC) 20 MG capsule, Take 20 mg by mouth daily., Disp: , Rfl:   ???  predniSONE (DELTASONE) 5 MG tablet, Take 2 tablets (10 mg total) by mouth daily., Disp: 60 tablet, Rfl: 3  ???  traZODone (DESYREL) 50 MG tablet, Take 50 mg by mouth nightly., Disp: , Rfl:     PHYSICAL EXAM:  BP 126/58 (BP Site: R Arm, BP Position: Sitting, BP Cuff Size: Large)  - Pulse 76  - Temp 36.3 ??C (97.4 ??F) (Oral)  - Wt 70.7 kg (155 lb 13.8 oz)  - BMI 29.45 kg/m??      GENERAL: WN, WD in NAD.  SKIN: no inflammatory, vasculitic rashes or nodules.  EYES: sclera and conjunctiva anicteric and non-injected.   ZOX:WRUE mucosa moist without exudates or ulcers. Neck supple, without JVD, LAD or thyromegaly.  CHEST: CTA.  CV: S1S2 RRR-M/G/R    ABD.: Soft, NT, ND, without OM, normal BS.  P.V.:  -c/c/e, normal pulses.  MUSCULOSKELETAL EXAM: swan neck deformities were present across all fingers. Synoviits was present at scattered PIP's and 2,3 MCP's and bilateral wrists. No synovits was noted at the elbows, shoulders or knees. MTP squeeze test was positive bilaterally and synovitis was present in both ankles. ROM of shoulders and hips was normal. SLR test was positive on the left side. IMPRESSION:    1-RA, active.  2-Left sided sciatica.  3-Osteoporosis.        PLAN:    Resume checking MTX labs every 3 weeks:  -     ALT  -     AST  -     CBC w/ Differential  -     Creatinine    Will also check today:    -     25 OH Vit D; Future  -     CRP  C-Reactive Protein; Future    Continue for now:    -  predniSONE (DELTASONE) 5 MG tablet; Take 2 tablets (10 mg total) by mouth daily.  -     leflunomide (ARAVA) 20 MG tablet; Take 1 tablet (20 mg total) by mouth daily.  -     hydroxychloroquine (PLAQUENIL) 200 mg tablet; Take 1 tablet (200 mg total) by mouth Two (2) times a day.    Will add for her neck and back pain :    -     home low back exercises    -     cyclobenzaprine (FLEXERIL) 5 MG tablet; Take 10 mg at bedtime    Left sided sciatica    -     Ambulatory referral to Physical Therapy; Future  -     Ambulatory referral to [facility] Center; Future     Patient wants to give golimumab more time before changing it to another biologic. .     -     golimumab (SIMPONI) 50 mg/0.5 mL PnIj pen injector; INJECT THE CONTENTS OF 1 AUTOINJECTOR (50MG ) UNDER THE SKIN EVERY 28 DAYS    Other orders  -     INFLUENZA VACCINE (QUAD) IM - 6 MO-ADULT - PF    After careful  Review of side effects, she agreed to a trial of Reclast for OP. Literature on Reclast in Spanish was provided.     3 months with Carlus Pavlov, PA and 6 months me.

## 2017-11-27 NOTE — Unmapped (Addendum)
Patient Education        Ci??tica: Ejercicios - [ Sciatica: Exercises ]  Instrucciones de cuidado  ??stos son algunos ejemplos de ejercicios t??picos de rehabilitaci??n para su afecci??n. Comience cada ejercicio lentamente. Reduzca la intensidad del ejercicio si empieza a Financial risk analyst.  Su m??dico o el fisioterapeuta le dir??n cu??ndo puede comenzar con estos ejercicios y cu??les funcionar??n mejor para usted.  Cuando no est?? activo, encuentre una posici??n c??moda para descansar. Algunas personas se sienten c??modas en el piso o en una cama de firmeza media con una almohada peque??a debajo de la cabeza y otra debajo de sus rodillas. Otras personas prefieren acostarse de lado con una almohada entre las rodillas. No permanezca en una posici??n por [place].  D?? paseos cortos (10 a 20 minutos) cada 2 a 3 horas. Evite las pendientes, las colinas y las escaleras hasta que se sienta mejor. S??lo camine distancias que pueda manejar sin dolor, especialmente dolor en las piernas.  C??mo se hacen los ejercicios  Estiramientos de la espalda    1. Apoye las manos y las rodillas en el piso.  2. Relaje la cabeza y d??jela caer. Arquee la espalda hacia el techo, hasta que sienta que las partes alta, media y baja se estiran agradablemente. Mantenga este estiramiento durante el tiempo que se sienta c??modo, o de 15 a 30 segundos.  3. Vuelva a la posici??n inicial con la espalda plana mientras est?? apoyado de manos y rodillas.  4. Deje que su espalda se nivele empujando el est??mago hacia el piso. Levante las nalgas (gl??teos) hacia el techo.  5. Mantenga esta posici??n durante 15 a 30 segundos.  6. Repita de 2 a 4 veces.    La atenci??n de seguimiento es una parte clave de su tratamiento y seguridad. Aseg??rese de hacer y acudir a todas las citas, y llame a su m??dico si est?? teniendo problemas. Tambi??n es una buena idea [employer] de sus ex??menes y [employer] de los medicamentos que toma.  ??D??nde puede encontrar m??s informaci??n en ingl??s?  Laurena Bering a [employer] en [email or web address]  [employer] (Biblioteca de Salud) en el men?? Resources (Recursos). Marcelino Freestone [id number] en la b??squeda para aprender m??s acerca de Ci??tica: Ejercicios - [ Sciatica: Exercises ].  Revisado: 26 junio, 2019  Versi??n del contenido: 12.2  ?? 2006-2019 Healthwise, Incorporated. Las instrucciones de cuidado fueron adaptadas bajo licencia por [facility]. Si usted tiene preguntas sobre una afecci??n m??dica o sobre estas instrucciones, siempre pregunte a su profesional de salud. Healthwise, Incorporated niega toda garant??a o responsabilidad por su uso de esta informaci??n.       Patient Education        zoledronic acid  Marca:  Reclast, Zometa  ??Cu??l es la informaci??n m??s importante que debo saber sobre zoledronic acid?  Zoledronic acid puede causar da??o al beb?? nonato. Evite quedar embarazada mientras est?? [employer] y d??gale a su m??dico si queda embarazada.  Zoledronic acid puede causar problemas serios de los ri??ones, especialmente si est?? deshidratado, si toma una medicina diur??tica, o si usted ya tiene enfermedad de los ri??ones. Llame a su m??dico si orina menos de lo usual, si tiene hinchaz??n en sus pies o tobillos, o si tiene sensaci??n de cansancio o que le falta el aire al respirar.  Tambi??n llame a su m??dico si usted tiene espasmos musculares, entumecimiento u hormigueo (en sus manos y pies o al rededor de la boca), dolor de la cadera nuevo o inusual, o dolor  severo en sus articulaciones, huesos, o m??sculos.  ??Qu?? es zoledronic acid?  Zoledronic acid (algunas veces llamado zoledronato) es una medicina bisfosfonato que altera la formaci??n y la destrucci??n del tejido ??seo (hueso) en el cuerpo. Esto puede disminuir la p??rdida de hueso y [place] a prevenir las fracturas.  Reclast y Zometa son [employer] de zoledronic acid.  Reclast se [place] para tratar la osteoporosis causada por la menopausia, el uso de esteroides, o la falla gonadal. Esta medicina se [place] cuando hay un alto riesgo de fractura de los huesos debido a la osteoporosis. Reclast tambi??n se [place] para tratar la enfermedad de [place] de los huesos.  Zometa se [place] para tratar altos niveles de calcio causados por c??ncer (tambi??n llamada hipercalcemia asociada con la malignidad). Zometa tambi??n trata el mieloma m??ltiple (un tipo de c??ncer de la m??dula ??sea) o c??ncer de los [employer] se ha propagado de [place] parte del cuerpo.  Usted no Neurosurgeon y Zometa al [employer].   Zoledronic acid puede tambi??n usarse para fines no mencionados en esta gu??a del medicamento.  ??Qu?? deber??a discutir con [employer] de la salud antes de recibir zoledronic acid?  Usted no debe ser tratado con zoledronic acid si es al??rgico a ??ste.  Usted tampoco deber??a recibir Reclast si usted tiene:  ?? niveles bajos de calcio en la sangre (hipocalcemia); o  ?? enfermedad severa del ri????n.  Usted no debe ser tratado con zoledronic acid si actualmente est?? usando cualquier otro bisfosfonato (como alendronate, etidronate, ibandronate, pamidronate, risedronate, o tiludronate).  Para asegurarse que zoledronic acid es seguro para usted, d??gale a su m??dico si alguna vez ha tenido:  ?? enfermedad del ri????n;  ?? hipocalcemia;  ?? cirug??a de tiroides o paratiroides;  ?? cirug??a para remover parte de su intestino;  ?? asma causada por tomar aspirin;  ?? cualquier condici??n que hace m??s dif??cil absorber nutrientes de las comidas (malabsorci??n); o  ?? un problema dental (usted puede necesitar un examen dental antes de recibir zoledronic acid).  Zoledronic acid puede causar problemas serios de los ri??ones, especialmente si est?? deshidratado, si toma una medicina diur??tica, o si usted ya tiene enfermedad de los ri??ones.  En casos raros, esta medicina puede causar p??rdida del hueso (osteonecrosis) de la mand??bula. Los s??ntomas incluyen dolor o entumecimiento de la mand??bula, enc??as rojas o hinchadas, dientes flojos, o cicatrizaci??n lenta despu??s de alg??n tratamiento dental. Mientras m??s tiempo use zoledronic acid, m??s probabilidades tendr?? usted de desarrollar esta condici??n.  La osteonecrosis de la mand??bula puede ocurrir con mayor probabilidad si usted tiene c??ncer o ha recibido quimioterapia, radiaci??n, o esteroides. Otros factores de riesgo incluyen problemas de la coagulaci??n de la sangre, anemia (niveles bajos de las c??lulas rojas de la [place]), y un problema dental preexistente.  Zoledronic acid puede causar da??o al beb?? nonato. Use un m??todo efectivo de control de la natalidad para prevenir el Psychologist, counselling est?? [employer]. D??gale a su m??dico si usted [place]. Usted tambi??n Engineer, agricultural de la natalidad por varias semanas despu??s que recibi?? zoledronic acid por ??ltima vez. Zoledronic acid puede tener efectos de larga duraci??n sobre su cuerpo.  Zoledronic acid puede pasar a la [place] materna y Scientist, clinical (histocompatibility and immunogenetics)??o al beb?? lactante. Usted no debe amamantar mientras [place] esta medicina.  ??C??mo se administra zoledronic acid?  Zoledronic acid se inyecta en una vena a trav??s de una inyecci??n intravenosa. Un profesional del cuidado de [employer] dar?? esta inyecci??n.  Zoledronic acid es  a veces administrado como una sola dosis [employer]. A veces tambi??n se puede administrar una vez cada 1 ?? 2 a??os. La frecuencia que usted recibir?? zoledronic acid depender?? en la raz??n para Arboriculturist. Siga las instrucciones de su m??dico.  Beba al menos 2 vasos de agua unas horas antes de su inyecci??n para evitar deshidratarse.  Usted puede necesitar pruebas m??dicas frecuentes para ayudar a su m??dico a determinar la duraci??n de su tratamiento con zoledronic acid. La funci??n de sus ri??ones puede tambi??n necesitar ser examinada.  Preste atenci??n especial a su higiene oral mientras use zoledronic acid. Cep??llese los dientes y use hilo dental con regularidad. Si usted necesita tener cualquier procedimiento dental (especialmente cirug??a), d??gale al dentista por adelantado que est?? usando zoledronic acid.  Zoledronic acid es s??lo parte de un programa completo de tratamiento que tambi??n puede incluir cambios de dieta y suplementos vitam??nicos. Siga atentamente las instrucciones de dosificaci??n que le d?? su m??dico  Su m??dico determinar?? por cu??nto tiempo debe darle tratamiento con zoledronic acid. Zoledronic acid por lo general se administra por s??lo 3 a 5 a??os.  ??Qu?? sucede si me salto una dosis?  Llame a su m??dico para recibir instrucciones si usted pierde una cita m??dica para su inyecci??n de zoledronic acid.  ??Qu?? suceder??a en una sobredosis?  Busque atenci??n m??dica de emergencia o llame a la l??nea de [employer] al [phone].  ??Qu?? debo evitar mientras recibo zoledronic acid?  Evite fumar, o trate de dejar de fumar. Fumar puede reducir su densidad mineral del hueso, haciendo m??s probables las fracturas.  Evite beber grandes cantidades de alcohol. Beber alcohol en grandes cantidades tambi??n puede causar p??rdida de hueso.  ??Cu??les son los efectos secundarios posibles de zoledronic acid?  Busque atenci??n m??dica de emergencia si usted tiene s??ntomas de una reacci??n al??rgica: ronchas; silbido, opresi??n en el pecho, dificultad para respirar; hinchaz??n de la cara, labios, lengua, o garganta.  Llame a su m??dico de inmediato si usted tiene:  ?? dolor nuevo o inusual en su cadera o muslo;  ?? dolor, entumecimiento, o hinchaz??n en la mand??bula;  ?? problemas del ri????n --orinar poco o nada, hinchaz??n en sus pies o tobillos, sensaci??n de cansancio o le falta aire al respirar;  ?? dolor severo de las articulaciones, huesos, o m??sculos; o  ?? bajos niveles de calcio --espasmos o contracciones musculares, entumecimiento o sensaci??n de hormigueo (alrededor de su boca, o en los dedos de sus manos o pies).  Efectos secundarios serios en los ri??ones pueden ser m??s probables en los adultos [place].  Efectos secundarios comunes pueden incluir:  ?? n??usea, v??mito, diarrea, estre??imiento;  ?? dolor de los huesos, dolor muscular o de las articulaciones;  ?? fiebre u otros s??ntomas de la gripe;  ?? dolor en sus brazos o piernas;  ?? ojos enrojecidos o hinchados;  ?? dolor de cabeza, cansancio; o  ?? dificultad para respirar.  Esta lista no menciona todos los efectos secundarios y puede ser que ocurran otros. Llame a su m??dico para consejos m??dicos relacionados a efectos secundarios. Usted puede reportar efectos secundarios llamando al FDA al 1-800-FDA-1088.  ??Qu?? otras drogas afectar??n a zoledronic acid?  Zoledronic acid puede hacerle da??o a sus ri??ones. Este efecto aumenta cuando usted tambi??n [place] ciertas otras medicinas, incluyendo: antivirales, quimioterapia, antibi??ticos inyectables, medicina para los trastornos intestinales, medicina para prevenir el Dispensing optician de ??rgano trasplantado, medicamento inyectable para la osteoporosis, y algunas medicinas para el dolor o la artritis (incluyendo aspirin, Tylenol, Advil, y Aleve).  Otras  drogas pueden interactuar con zoledronic acid, incluyendo medicinas que se obtienen con o sin receta, vitaminas, y productos herbarios. D??gale a cada uno de sus proveedores de salud acerca de todas las medicinas que usted est?? [place], y cualquier medicina que usted comience o deje de Water engineer.  ??D??nde puedo obtener m??s informaci??n?  Su m??dico o farmac??utico le puede dar m??s informaci??n acerca de zoledronic acid.  Recuerde, mantenga ??sta y todas las otras medicinas fuera del alcance de los ni??os, no comparta nunca sus medicinas con otros, y use este medicamento s??lo para la condici??n por la que fue recetada.  Se ha hecho todo lo posible para que la informaci??n que proviene de [employer] ('[employer]') sea precisa, actual, y [place], pero no se hace garant??a de tal. La informaci??n sobre el medicamento incluida aqu?? puede tener nuevas recomendaciones. La informaci??n preparada por Mult[employer] se ha creado para uso del profesional de la salud y para el consumidor en los [place] y por lo cual Mult[employer] no certifica que el uso fuera de los [place] sea apropiado, a menos que se mencione espec??ficamente lo cual. La informaci??n de [employer] sobre drogas no sanciona drogas, ni diagn??stica al paciente o recomienda terapia. La informaci??n de [employer] sobre drogas sirve como una fuente de informaci??n dise??ada para la [place] del profesional de la salud licenciado en el cuidado de sus pacientes y/o para servir al consumidor que reciba este servicio como un suplemento a, y no como sustituto de la competencia, experiencia, conocimiento y opini??n del profesional de [employer]. La ausencia en ??ste de una advertencia para [place] droga o combinaci??n de drogas no debe, de ninguna forma, interpretarse como que la droga o la combinaci??n de drogas sean seguras, efectivas, o apropiadas para cualquier paciente. Mult[employer] no se responsabiliza por ning??n aspecto del cuidado m??dico que reciba con la ayuda de la informaci??n que proviene de [employer]. La informaci??n incluida aqu?? no se ha creado con la intenci??n de cubrir [place], instrucciones, precauciones, advertencias, interacciones con otras drogas, reacciones al??rgicas, o efectos secundarios. Si usted tiene [place] pregunta acerca de las drogas que est?? tomando, consulte con su m??dico, enfermera, o farmac??utico.  Copyright [phone] [employer] Inc. Version: 16.02. Revision date: 01/22/2016.  Las instrucciones de cuidado fueron adaptadas bajo licencia por [facility]. Si usted tiene preguntas sobre una afecci??n m??dica o sobre estas instrucciones, siempre pregunte a su profesional de salud. Healthwise, Incorporated niega toda garant??a o responsabilidad por su uso de esta informaci??n.

## 2017-12-03 ENCOUNTER — Other Ambulatory Visit: Payer: Self-pay

## 2017-12-03 ENCOUNTER — Emergency Department
Admission: EM | Admit: 2017-12-03 | Discharge: 2017-12-03 | Disposition: A | Discharge status: Home / Self Care | Payer: Self-pay | Attending: Emergency Medicine | Admitting: Emergency Medicine

## 2017-12-03 ENCOUNTER — Encounter: Payer: Self-pay | Admitting: Emergency Medicine

## 2017-12-03 ENCOUNTER — Emergency Department: Payer: Self-pay

## 2017-12-03 DIAGNOSIS — R202 Paresthesia of skin: Secondary | ICD-10-CM | POA: Insufficient documentation

## 2017-12-03 DIAGNOSIS — R200 Anesthesia of skin: Secondary | ICD-10-CM

## 2017-12-03 DIAGNOSIS — J45909 Unspecified asthma, uncomplicated: Secondary | ICD-10-CM | POA: Insufficient documentation

## 2017-12-03 DIAGNOSIS — I10 Essential (primary) hypertension: Secondary | ICD-10-CM | POA: Insufficient documentation

## 2017-12-03 DIAGNOSIS — Z79899 Other long term (current) drug therapy: Secondary | ICD-10-CM | POA: Insufficient documentation

## 2017-12-03 HISTORY — DX: Sciatica, unspecified side: M54.30

## 2017-12-03 HISTORY — DX: Age-related osteoporosis without current pathological fracture: M81.0

## 2017-12-03 LAB — DIFFERENTIAL
ABS IMMATURE GRANULOCYTES: 0.02 10*3/uL (ref 0.00–0.07)
BASOS PCT: 2 %
Basophils Absolute: 0.1 10*3/uL (ref 0.0–0.1)
Eosinophils Absolute: 0.1 10*3/uL (ref 0.0–0.5)
Eosinophils Relative: 2 %
Immature Granulocytes: 0 %
LYMPHS PCT: 15 %
Lymphs Abs: 1 10*3/uL (ref 0.7–4.0)
Monocytes Absolute: 0.4 10*3/uL (ref 0.1–1.0)
Monocytes Relative: 7 %
NEUTROS ABS: 5 10*3/uL (ref 1.7–7.7)
Neutrophils Relative %: 74 %

## 2017-12-03 LAB — CBC
HEMATOCRIT: 40 % (ref 36.0–46.0)
HEMOGLOBIN: 12.8 g/dL (ref 12.0–15.0)
MCH: 26.2 pg (ref 26.0–34.0)
MCHC: 32 g/dL (ref 30.0–36.0)
MCV: 81.8 fL (ref 80.0–100.0)
Platelets: 392 10*3/uL (ref 150–400)
RBC: 4.89 MIL/uL (ref 3.87–5.11)
RDW: 14.4 % (ref 11.5–15.5)
WBC: 6.6 10*3/uL (ref 4.0–10.5)
nRBC: 0 % (ref 0.0–0.2)

## 2017-12-03 LAB — COMPREHENSIVE METABOLIC PANEL
ALK PHOS: 110 U/L (ref 38–126)
ALT: 22 U/L (ref 0–44)
AST: 29 U/L (ref 15–41)
Albumin: 4.7 g/dL (ref 3.5–5.0)
Anion gap: 11 (ref 5–15)
BUN: 18 mg/dL (ref 6–20)
CALCIUM: 9.4 mg/dL (ref 8.9–10.3)
CHLORIDE: 97 mmol/L — AB (ref 98–111)
CO2: 29 mmol/L (ref 22–32)
CREATININE: 0.51 mg/dL (ref 0.44–1.00)
Glucose, Bld: 121 mg/dL — ABNORMAL HIGH (ref 70–99)
Potassium: 3.9 mmol/L (ref 3.5–5.1)
Sodium: 137 mmol/L (ref 135–145)
Total Bilirubin: 0.6 mg/dL (ref 0.3–1.2)
Total Protein: 8.3 g/dL — ABNORMAL HIGH (ref 6.5–8.1)

## 2017-12-03 LAB — APTT: aPTT: 30 seconds (ref 24–36)

## 2017-12-03 LAB — TROPONIN I: Troponin I: <0.03 ng/mL (ref <0.03)

## 2017-12-03 LAB — PROTIME-INR
INR: 0.94
Prothrombin Time: 12.5 seconds (ref 11.4–15.2)

## 2017-12-03 NOTE — Discharge Instructions (Addendum)
It is possible that you had a TIA but your neurologic exam and tests right now are normal.  Follow-up with your doctor on Friday as scheduled.  Return to the ER for new, worsening, persistent severe high blood pressure, numbness or weakness, difficulty speaking, changes in your vision, or any other new or worsening symptoms that concern you.

## 2017-12-03 NOTE — ED Notes (Addendum)
First Nurse Note:  Patient presents to the ED with a complaint of hypertension, headache, and blurry vision since 1pm. Patient has history of HTN and takes medication for it.

## 2017-12-03 NOTE — ED Provider Notes (Signed)
[facility] Emergency Department Provider Note ____________________________________________   First MD Initiated Contact with Patient 12/03/17 2139     (approximate)  I have reviewed the triage vital signs and the nursing notes.   HISTORY  Chief Complaint Numbness    HPI Maria Griffin is a 57 y.o. female with PMH as noted below including hypertension who presents with elevated blood pressure to the 180s approximately 8 hours ago and now resolved.  It was associated with a dull frontal headache, slightly blurred vision, and dizziness which she describes as mild spinning.  She also states that she developed tingling and numbness in her left arm and left leg.  The symptoms lasted up to about 2 hours and have now resolved.  She denies any prior history of this.  She denies any associated chest pain or shortness of breath, weakness, speech difficulty, or vision changes.  Past Medical History:  Diagnosis Date  . Arthritis   . Asthma   . Collagen vascular disease (HCC)   . Hypertension   . Osteoporosis   . Sciatica     Patient Active Problem List   Diagnosis Date Noted  . Depression 10/07/2014  . Arthritis 10/07/2014    Past Surgical History:  Procedure Laterality Date  . CESAREAN SECTION    . CHOLECYSTECTOMY    . UTERINE FIBROID SURGERY      Prior to Admission medications   Medication Sig Start Date End Date Taking? Authorizing Provider  albuterol (PROVENTIL HFA;VENTOLIN HFA) 108 (90 BASE) MCG/ACT inhaler Inhale 2 puffs into the lungs every 4 (four) hours as needed for wheezing or shortness of breath.    [provider]  albuterol (PROVENTIL) (2.5 MG/3ML) 0.083% nebulizer solution Take 2.5 mg by nebulization every 4 (four) hours as needed for wheezing or shortness of breath.    [provider]  Calcium Carbonate-Vitamin D (CALCIUM 600+D) 600-400 MG-UNIT per tablet Take 1 tablet by mouth 2 (two) times daily.    [provider]  cephALEXin (KEFLEX) 500 MG capsule Take 1 capsule (500 mg total) by mouth 3 (three) times daily. 02/21/16   Irean Hong, MD  Fluticasone-Salmeterol (ADVAIR) 500-50 MCG/DOSE AEPB Inhale 1 puff into the lungs 2 (two) times daily.    [provider]  Golimumab 50 MG/0.5ML SOAJ Inject 50 mg into the skin every 30 (thirty) days.    [provider]  hydroxychloroquine (PLAQUENIL) 200 MG tablet Take 200 mg by mouth 2 (two) times daily.    [provider]  leflunomide (ARAVA) 20 MG tablet Take 20 mg by mouth daily.    [provider]  montelukast (SINGULAIR) 10 MG tablet Take 10 mg by mouth at bedtime.    [provider]  ondansetron (ZOFRAN ODT) 4 MG disintegrating tablet Take 1 tablet (4 mg total) by mouth every 8 (eight) hours as needed for nausea or vomiting. 02/21/16   Irean Hong, MD  predniSONE (DELTASONE) 5 MG tablet Take 2 tablets (10 mg total) by mouth daily. 10/07/14   Jene Every, MD    Allergies Codeine and Morphine and related  Family History  Problem Relation Age of Onset  . Breast cancer Sister 46  . Ovarian cancer Sister   . Breast cancer Sister 63    Social History Social History   Tobacco Use  . Smoking status: Never Smoker  . Smokeless tobacco: Never Used  Substance Use Topics  . Alcohol use: No  . Drug use: Not on file  Review of Systems  Constitutional: No fever/chills Eyes: No visual changes. ENT: No neck pain. Cardiovascular: Denies chest pain. Respiratory: Denies shortness of breath. Gastrointestinal: No vomiting Genitourinary: Negative for flank pain.  Musculoskeletal: Negative for back pain. Skin: Negative for rash. Neurological: Positive for resolved headache and numbness.  Negative for weakness.   ____________________________________________   PHYSICAL EXAM:  VITAL SIGNS: ED Triage Vitals  Enc Vitals Group     BP 12/03/17 1719 (!) 184/74     Pulse Rate 12/03/17 1719 81     Resp  12/03/17 1719 18     Temp 12/03/17 1719 98.8 F (37.1 C)     Temp Source 12/03/17 1719 Oral     SpO2 12/03/17 1719 96 %     Weight 12/03/17 1729 155 lb (70.3 kg)     Height 12/03/17 1729 5' (1.524 m)     Head Circumference --      Peak Flow --      Pain Score 12/03/17 2138 6     Pain Loc --      Pain Edu? --      Excl. in GC? --     Constitutional: Alert and oriented. Well appearing and in no acute distress. Eyes: Conjunctivae are normal.  EOMI.  PERRLA. Head: Atraumatic. Nose: No congestion/rhinnorhea. Mouth/Throat: Mucous membranes are moist.   Neck: Normal range of motion.  Cardiovascular: Normal rate, regular rhythm. Grossly normal heart sounds.  Good peripheral circulation. Respiratory: Normal respiratory effort.  No retractions. Lungs CTAB. Gastrointestinal: No distention.  Musculoskeletal: Extremities warm and well perfused.  Neurologic:  Normal speech and language.  5/5 motor strength  and sensory intact in all extremities.  Normal coordination on finger-to-nose.  No gross focal neurologic deficits are appreciated.  Skin:  Skin is warm and dry. No rash noted. Psychiatric: Mood and affect are normal. Speech and behavior are normal.  ____________________________________________   LABS (all labs ordered are listed, but only abnormal results are displayed)  Labs Reviewed  COMPREHENSIVE METABOLIC PANEL - Abnormal; Notable for the following components:      Result Value   Chloride 97 (*)    Glucose, Bld 121 (*)    Total Protein 8.3 (*)    All other components within normal limits  PROTIME-INR  APTT  CBC  DIFFERENTIAL  TROPONIN I   ____________________________________________  EKG  ED ECG REPORT I, Dionne Bucy, the attending physician, personally viewed and interpreted this ECG.  Date: 12/03/2017 EKG Time: 1757 Rate: 70 Rhythm: normal sinus rhythm QRS Axis: normal Intervals: normal ST/T Wave abnormalities: normal Narrative Interpretation: no  evidence of acute ischemia  ____________________________________________  RADIOLOGY  CT head: No ICH or other acute abnormality  ____________________________________________   PROCEDURES  Procedure(s) performed: No  Procedures  Critical Care performed: No ____________________________________________   INITIAL IMPRESSION / ASSESSMENT AND PLAN / ED COURSE  Pertinent labs & imaging results that were available during my care of the patient were reviewed by me and considered in my medical decision making (see chart for details).  57 year old female with PMH as noted above including history of hypertension for which she takes medications presents with an episode of elevated blood pressure along with mild frontal headache, some dizziness, and some numbness/tingling in the left arm and foot.  This resolved after approximately 2 hours.  The patient was in the ED for approximately 4 hours before being placed in the room and evaluated.  During this time she had labs and CT.  Her repeat blood pressure  is now significantly improved without intervention.  On exam the patient is comfortable appearing.  She states that she is asymptomatic currently.  Her vital signs are normal except for mild hypertension.  Neuro exam is totally normal at this time.    The ED work-up has been entirely negative including CT and labs.  Overall I suspect most likely benign cause such as symptoms related to hypertension, mild vertigo, radiculopathy, or other nonspecific cause.  I have an extremely low suspicion for TIA given the atypical and resolved symptoms.  The patient's ABCD 2 score is 3 which places her at relatively low risk.  She has an appointment already with her primary doctor the day after tomorrow which I think is appropriately close follow-up.  At this time there is no indication for further ED work-up.  The patient is feeling much better and would like to go home.  I counseled her and her daughter on the  results of the work-up.  Return precautions given, and the patient expresses understanding.  ____________________________________________   FINAL CLINICAL IMPRESSION(S) / ED DIAGNOSES  Final diagnoses:  Hypertension, unspecified type  Numbness      NEW MEDICATIONS STARTED DURING THIS VISIT:  Discharge Medication List as of 12/03/2017  9:55 PM       Note:  This document was prepared using Dragon voice recognition software and may include unintentional dictation errors.    Dionne Bucy, MD 12/03/17 2211

## 2017-12-03 NOTE — ED Triage Notes (Signed)
Says at noon started sweating.  She kept working but noticed that her left arm felt numb and her left foot felt numb.  Her face is symetrical.  No weakness. Has a tick in left eye.  Had bp taken at 4p at work and it was elevated.

## 2017-12-11 IMAGING — CT CT ABD-PELV W/ CM
2 of 5 series · 16 of 46 positions shown, 18 images · IV contrast (APPLIED)
Comparison: None.

CLINICAL DATA: Abdominal pain and nausea since [DATE]

EXAM:
CT ABDOMEN AND PELVIS WITH CONTRAST
TECHNIQUE: Multidetector CT imaging of the abdomen and pelvis was performed
using the standard protocol following bolus administration of
intravenous contrast.
CONTRAST:  75 mL Isovue 370 intravenous

[Series 2: routine abd/pel with · axial · 0.80mm/px · z∈[-842,-477]mm · 13 of 83 slices shown, 15 images]
[im 5/83  soft-tissue]
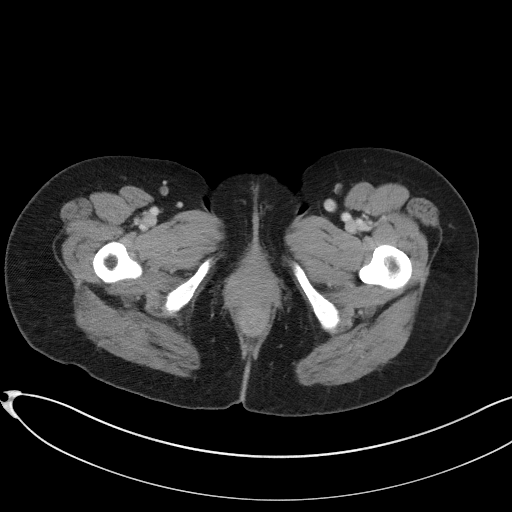
[im 5/83  bone]
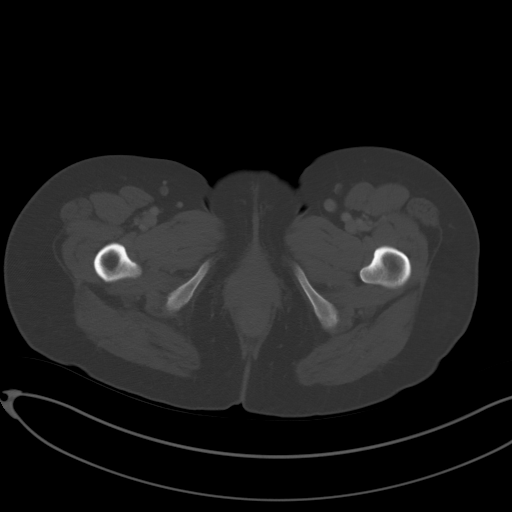
[im 10/83  soft-tissue]
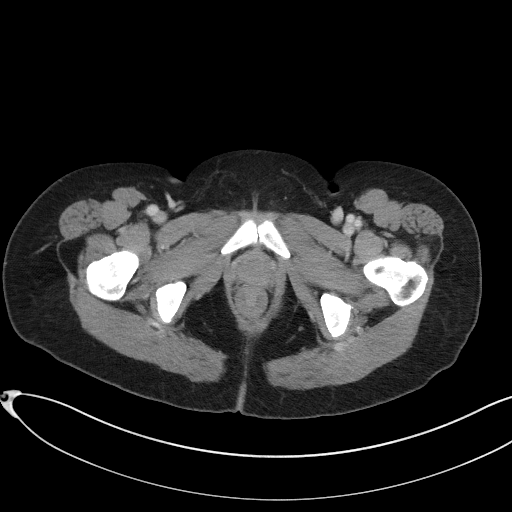
[im 19/83  soft-tissue]
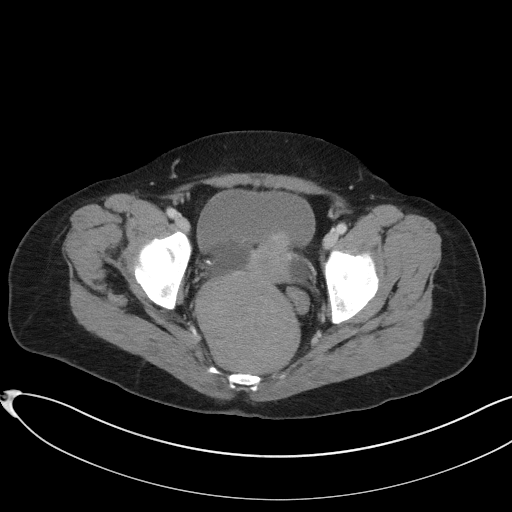
[im 23/83  soft-tissue]
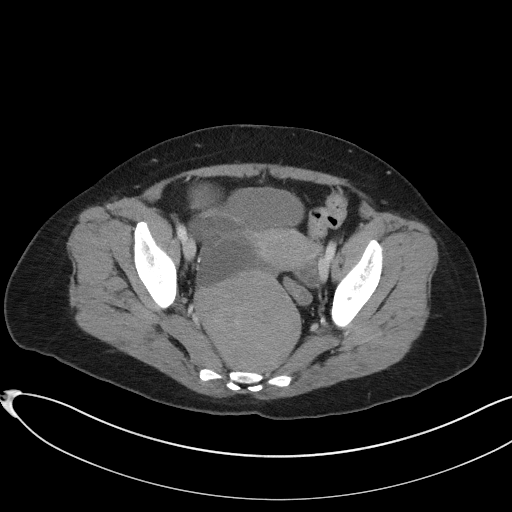
[im 28/83  soft-tissue]
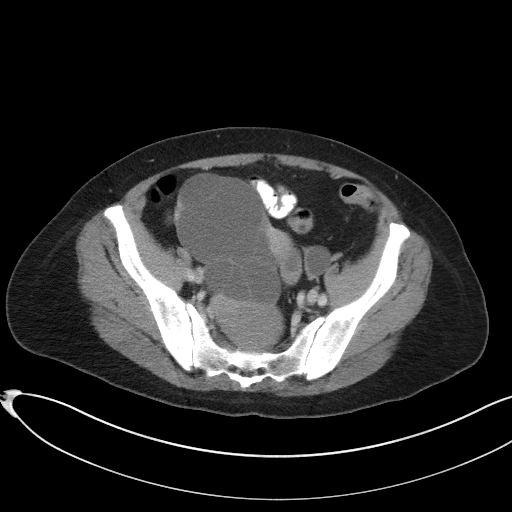
[im 37/83  soft-tissue]
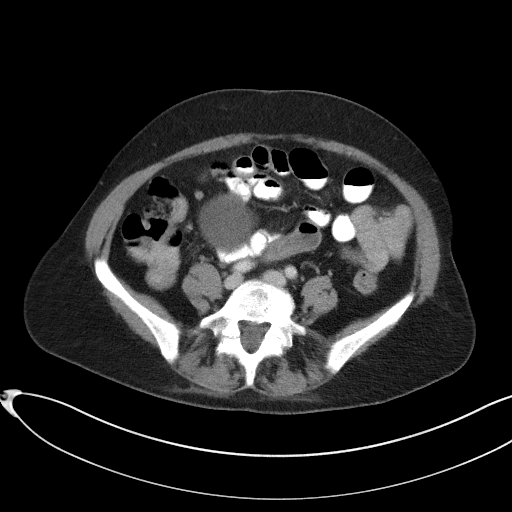
[im 42/83  soft-tissue]
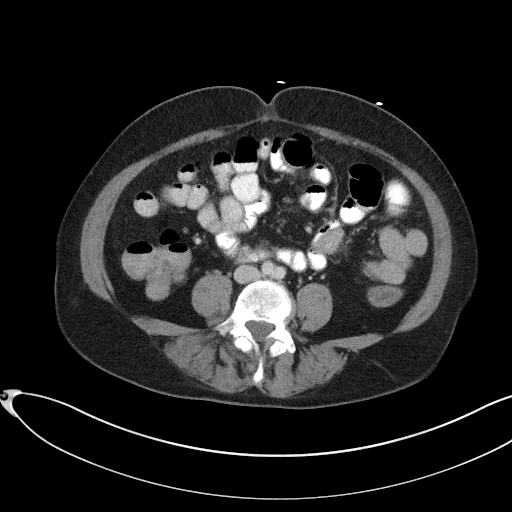
[im 46/83  soft-tissue]
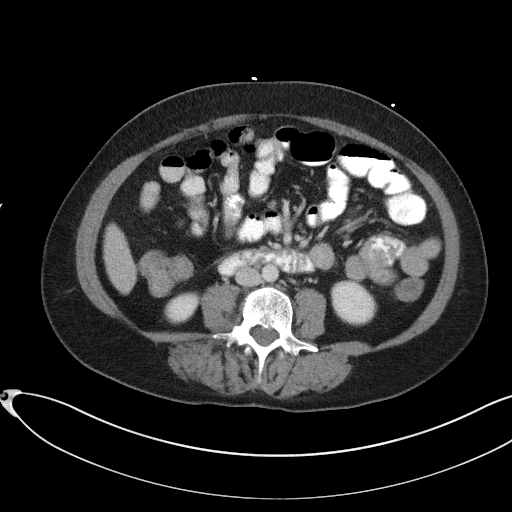
[im 55/83  soft-tissue]
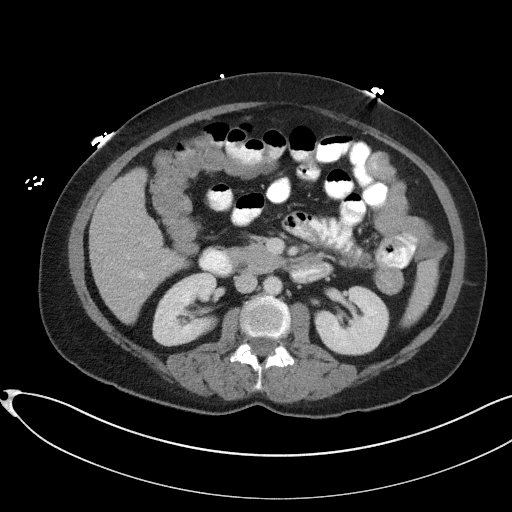
[im 55/83  bone]
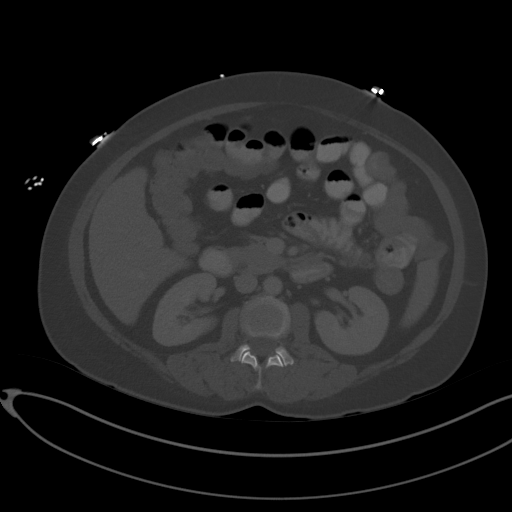
[im 60/83  soft-tissue]
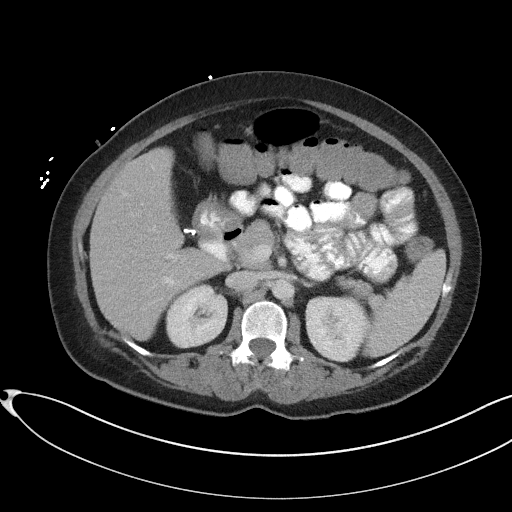
[im 64/83  soft-tissue]
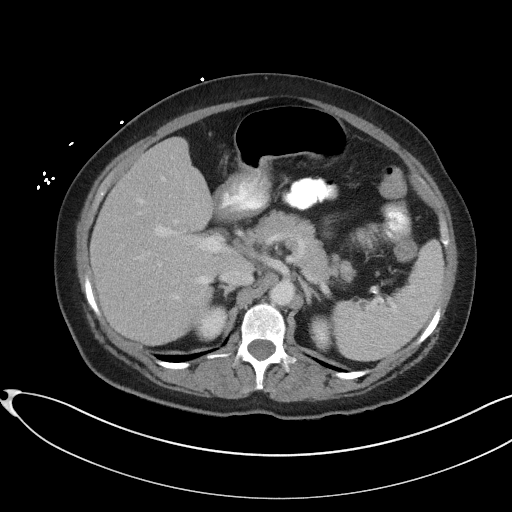
[im 73/83  soft-tissue]
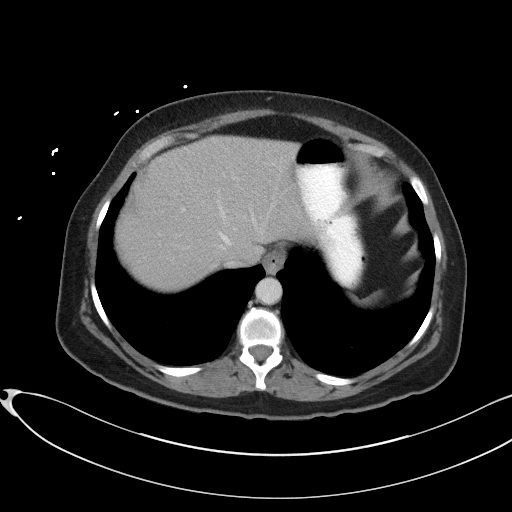
[im 78/83  soft-tissue]
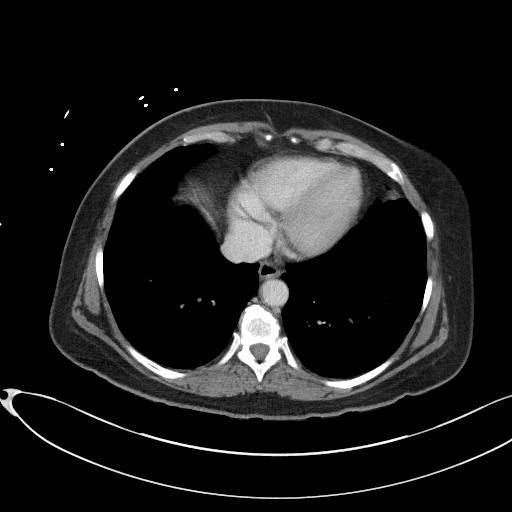

[Series 5: coronal st · coronal · 0.68mm/px · 3 of 85 slices shown]
[im 29/85  soft-tissue]
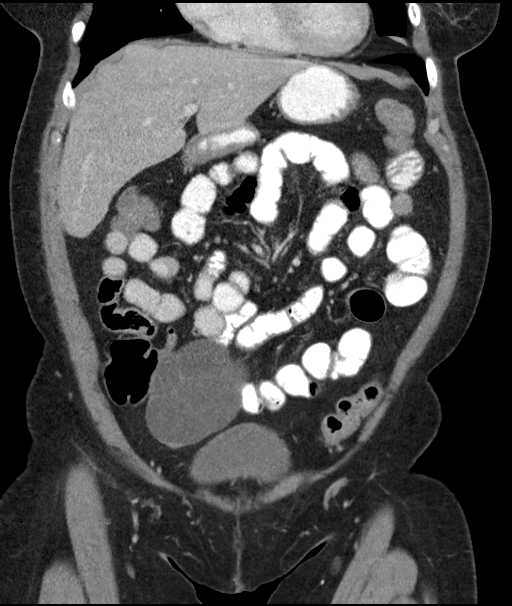
[im 38/85  soft-tissue]
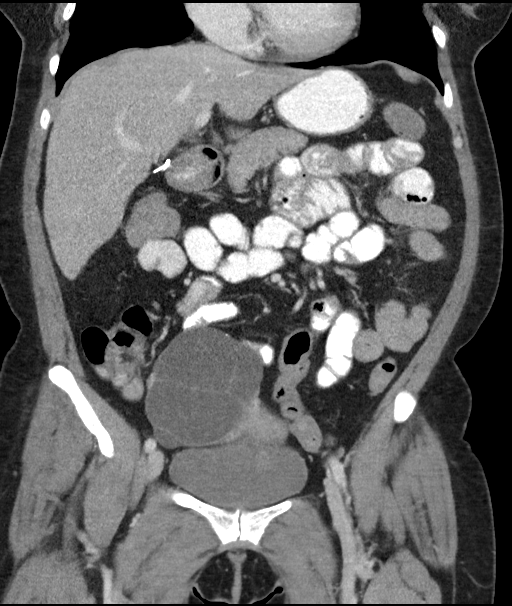
[im 47/85  soft-tissue]
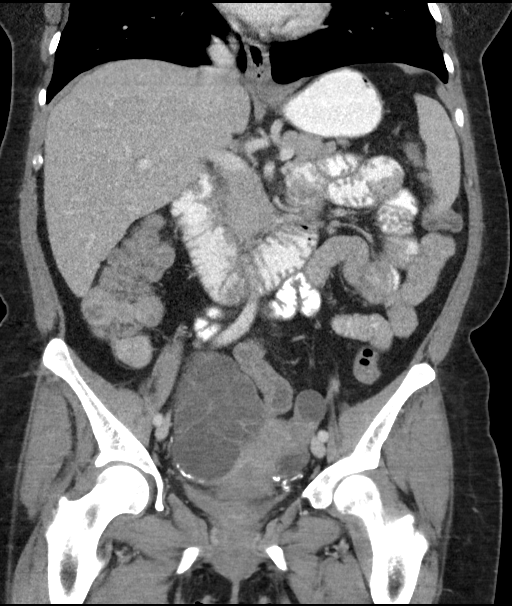

[16 of 46 positions shown; findings below may reference images not displayed]

FINDINGS: Lower chest: No acute abnormality.

Hepatobiliary: No focal liver abnormality is seen. Status post
cholecystectomy. No biliary dilatation.

Pancreas: Unremarkable. No pancreatic ductal dilatation or
surrounding inflammatory changes.

Spleen: Normal in size without focal abnormality.

Adrenals/Urinary Tract: Adrenal glands are unremarkable. Kidneys are
normal, without renal calculi, focal lesion, or hydronephrosis.
Bladder is unremarkable.

Stomach/Bowel: Stomach is within normal limits. Appendix is normal.
No evidence of bowel wall thickening, distention, or inflammatory
changes.

Vascular/Lymphatic: The abdominal aorta is normal in caliber with
mild atherosclerotic calcification. No adenopathy.

Reproductive: There is a complex right adnexal mass measuring
approximately 7.5 x 8 x 17 cm with solid and multilocular cystic
components. This is probably of right ovarian origin. A smaller left
ovarian mass is present measuring approximately 4 x 5 cm. Uterus is
remarkable only for an anterior midbody fibroid measuring 1.8 cm.

Other: No ascites.  No omental mass.

Musculoskeletal: No significant skeletal lesion. There is bilateral
L5 spondylolysis with grade 1 spondylolisthesis at L5-S1
IMPRESSION: Right adnexal mass measuring up to 17 cm with solid and cystic
components. This is probably a right ovarian neoplasm. Smaller left
ovarian mass. No ascites, adenopathy or omental lesion. Pelvic
ultrasound and pelvic MRI may be helpful for better
characterization.

## 2017-12-11 NOTE — Unmapped (Signed)
[facility] - Pharmacist Counseling Notes    Sara Wagner is a 57 y.o. female being initiated on Reclast for Osteoporosis. Medication list, allergies and comorbidities reviewed:  appropriate to initiate therapy.   Pending Vitamin D and Calcium level - patient will complete locally, order faxed to PCP office.      Regimen & Administration: Reclast 5mg  Infusion once every year.    Administer without regards to meal.       Drug-Drug & Drug-Food Interactions:  None noted    Side-effects:    ?? Discussed potential side effects of flu-like symptoms a few days after infusion, nausea, atypical femur fractures, hypocalcemia, musculoskeletal pain, and ONJ  ?? Emphasized the importance of calcium and vitamin D supplementation as well as adequate hydration  ?? Counseled on signs of a significant drug reaction (wheezing, chest tightness, fever, itching, cough, blue skin color, seizures, swelling of face, lips, tongue or throat, etc)    Emphasized the importance of adherence to prescribed regimen, clinic follow-up visits, and laboratory testing.      All questions were answered and contact information provided for any future questions/concerns.      Margret Chance, PharmD Candidate

## 2017-12-11 NOTE — Unmapped (Signed)
[facility] RHEUMATOLOGY CLINIC - PHARMACIST NOTES    Attempted to contact patient for Reclast counseling and to inform patient to obtain labs before starting the medication.    First attempt 12/11/17 - via [employer]. Left VM    Margret Chance, PharmD Candidate

## 2017-12-12 NOTE — Unmapped (Signed)
Erroneous encounter

## 2017-12-15 MED ORDER — GOLIMUMAB 50 MG/0.5 ML SUBCUTANEOUS PEN INJECTOR
9 refills | 0 days | Status: CP
Start: 2017-12-15 — End: 2018-12-15
  Filled 2017-12-18: qty 0.5, 28d supply, fill #0

## 2017-12-15 NOTE — Unmapped (Signed)
[facility] Refill Coordination Note    Specialty Medication(s) to be Shipped:   Inflammatory Disorders: Simponi    Other medication(s) to be shipped: n/a     Sara Wagner, DOB: [birth year]  Phone: [phone] (home)       All above HIPAA information was verified with daughter Sara Wagner     Completed refill call assessment today to schedule patient's medication shipment from the [facility] [phone]).       Specialty medication(s) and dose(s) confirmed: Regimen is correct and unchanged.   Changes to medications: Sara Wagner reports no changes reported at this time.  Changes to insurance: No  Questions for the pharmacist: No    The patient will receive a drug information handout for each medication shipped and additional FDA Medication Guides as required.      DISEASE/MEDICATION-SPECIFIC INFORMATION        For Rheumatology patients: Next dose of simponi from this shipment due on 11/1    ADHERENCE     Medication Adherence    Patient reported X missed doses in the last month:  0  Specialty Medication:  simponi  Patient is on additional specialty medications:  No  Patient is on more than two specialty medications:  No  Any gaps in refill history greater than 2 weeks in the last 3 months:  no  Informant:  child/children  Reliability of informant:  reliable          Support network for adherence:  family member      Confirmed plan for next specialty medication refill:  delivery by pharmacy  Refills needed for supportive medications:  not needed          SHIPPING     Shipping address confirmed in Epic.     Delivery Scheduled: Yes, Expected medication delivery date: 10/31.  However, Rx request for refills was sent to the provider as there are none remaining.     Medication will be delivered via Same Day Courier to the prescription address in Epic WAM.    Sara Wagner   [facility] Specialty Technician

## 2017-12-16 NOTE — Unmapped (Signed)
[facility] RHEUMATOLOGY CLINIC - PHARMACIST NOTES    Attempted to f/u with patient regarding labs I faxed labs over to her PCP in [place] last week on 12/11/17. Need results back before starting Reclast infusion.    First Attempt: 12/16/17 - Left VM via [employer] interpreter     Margret Chance, PharmD Candidate

## 2017-12-17 NOTE — Unmapped (Signed)
[facility] RHEUMATOLOGY CLINIC - PHARMACIST NOTES    Patient returned call.  She reports labs were done at PCP office this past Monday, 12/15/17.  Will f/u for results.     Sara Wagner

## 2017-12-18 MED ORDER — CHOLECALCIFEROL (VITAMIN D3) 1,250 MCG (50,000 UNIT) TABLET
ORAL_TABLET | ORAL | 0 refills | 0 days | Status: CP
Start: 2017-12-18 — End: ?

## 2017-12-18 MED FILL — SIMPONI 50 MG/0.5 ML SUBCUTANEOUS PEN INJECTOR: 28 days supply | Qty: 0 | Fill #0 | Status: AC

## 2017-12-18 NOTE — Unmapped (Addendum)
[facility] RHEUMATOLOGY CLINIC - PHARMACIST NOTES    Contacted patient (via [employer]) to f/u with her recent lab results.  Patient completed labs on 12/15/17.  Resulted received from [facility]. Will scan into Epic.     Vitamin D-25-hydroxy:  15.1 ng/mL (L)  Calcium:  9.9 mg/dL   C-reactive protein:  2 mg/L     Vitamin D is low and warrants supplementation. Vit D3 50,000 IU prescription has been sent to her pharmacy. After completing the 8 week course of high dose vitamin D3, patient will switch to OTC vitamin D and calcium combination product daily.     Patient agreed to the above plan and will call us if she has any further questions.    Margret Chance, PharmD Candidate

## 2018-01-01 ENCOUNTER — Ambulatory Visit: Admit: 2018-01-01 | Discharge: 2018-01-02

## 2018-01-01 DIAGNOSIS — Z5181 Encounter for therapeutic drug level monitoring: Secondary | ICD-10-CM

## 2018-01-01 DIAGNOSIS — M059 Rheumatoid arthritis with rheumatoid factor, unspecified: Secondary | ICD-10-CM

## 2018-01-01 DIAGNOSIS — Z79899 Other long term (current) drug therapy: Secondary | ICD-10-CM

## 2018-01-01 DIAGNOSIS — M810 Age-related osteoporosis without current pathological fracture: Principal | ICD-10-CM

## 2018-01-01 LAB — CALCIUM: Calcium:MCnc:Pt:Ser/Plas:Qn:: 9.2

## 2018-01-01 LAB — C-REACTIVE PROTEIN: C reactive protein:MCnc:Pt:Ser/Plas:Qn:: 5.8

## 2018-01-01 NOTE — Unmapped (Signed)
Pt presents for First Reclast infusion.  VSS, premeds administered.  Patient and her daughter educated on medication, common side effects, and call bell within reach.  1459 Reclast started  1528 Reclast complete.  Pt tolerated without complication, VSS.  IV flushed per policy and d/c'd, gauze and coban applied.  Pt left clinic in no acute distress.

## 2018-01-02 LAB — VITAMIN D, TOTAL (25OH): Lab: 25.6

## 2018-01-19 NOTE — Unmapped (Signed)
[facility] Refill Coordination Note  Medication: simponi    Unable to reach patient to schedule shipment for medication being filled at [facility]. Left voicemail on phone.  As this is the 3rd unsuccessful attempt to reach the patient, no additional phone call attempts will be made at this time.      Phone numbers attempted: [phone] and [phone]   Last scheduled delivery: 12/18/17 (28 days)    Please call the [facility] at [phone] (option 4) should you have any further questions.      Thanks,  [facility] Specialty Team

## 2018-02-06 NOTE — Unmapped (Signed)
[facility] Refill Telephone Call   Medication: Simponi    Unable to reach patient to schedule shipment of Simponi filled through the [facility].  Last shipment was 12/18/17 for a 1 month supply of medication.     Fourth attempt to reach patient (02/06/18). Left voicemail with both patient and daughter, Raynelle Fanning. Patient may call the [facility] at [phone], option 4 to request for refill.    Jeneen Montgomery

## 2018-04-16 ENCOUNTER — Ambulatory Visit: Admit: 2018-04-16 | Discharge: 2018-04-17

## 2018-04-16 DIAGNOSIS — M5432 Sciatica, left side: Secondary | ICD-10-CM

## 2018-04-16 DIAGNOSIS — E559 Vitamin D deficiency, unspecified: Secondary | ICD-10-CM

## 2018-04-16 DIAGNOSIS — M059 Rheumatoid arthritis with rheumatoid factor, unspecified: Principal | ICD-10-CM

## 2018-04-16 DIAGNOSIS — M810 Age-related osteoporosis without current pathological fracture: Secondary | ICD-10-CM

## 2018-04-16 DIAGNOSIS — Z5181 Encounter for therapeutic drug level monitoring: Secondary | ICD-10-CM

## 2018-04-16 DIAGNOSIS — Z79899 Other long term (current) drug therapy: Secondary | ICD-10-CM

## 2018-04-16 LAB — AST (SGOT): Aspartate aminotransferase:CCnc:Pt:Ser/Plas:Qn:: 35

## 2018-04-16 LAB — CBC
HEMOGLOBIN: 11.7 g/dL — ABNORMAL LOW (ref 12.0–16.0)
MEAN CORPUSCULAR HEMOGLOBIN CONC: 32 g/dL (ref 31.0–37.0)
MEAN CORPUSCULAR HEMOGLOBIN: 27.3 pg (ref 26.0–34.0)
MEAN CORPUSCULAR VOLUME: 85.1 fL (ref 80.0–100.0)
MEAN PLATELET VOLUME: 7.4 fL (ref 7.0–10.0)
PLATELET COUNT: 396 10*9/L (ref 150–440)
RED BLOOD CELL COUNT: 4.29 10*12/L (ref 4.00–5.20)
RED CELL DISTRIBUTION WIDTH: 14.2 % (ref 12.0–15.0)
WBC ADJUSTED: 9.6 10*9/L (ref 4.5–11.0)

## 2018-04-16 LAB — CREATININE: EGFR CKD-EPI NON-AA FEMALE: >90 mL/min/{1.73_m2} (ref >=60)

## 2018-04-16 LAB — ALBUMIN: Albumin:MCnc:Pt:Ser/Plas:Qn:: 4.4

## 2018-04-16 LAB — ALT (SGPT): Alanine aminotransferase:CCnc:Pt:Ser/Plas:Qn:: 27

## 2018-04-16 LAB — CALCIUM: Calcium:MCnc:Pt:Ser/Plas:Qn:: 10.1

## 2018-04-16 LAB — HEMOGLOBIN: Lab: 11.7 — ABNORMAL LOW

## 2018-04-16 LAB — EGFR CKD-EPI AA FEMALE: Lab: >90

## 2018-04-16 MED ORDER — PREDNISONE 5 MG TABLET
ORAL_TABLET | Freq: Every day | ORAL | 3 refills | 0.00000 days | Status: CP
Start: 2018-04-16 — End: 2018-10-20

## 2018-04-16 NOTE — Unmapped (Signed)
[facility]: Rheumatology Clinic Assessment and Refill Call    Specialty Medication(s): Simponi  Indication(s): rheumatoid arthritis     Sara Wagner, DOB: 04-18-1960  Patient seen in clinic today.  Unable to get in touch with patient the past 2 months for refill and clinical assessment of Simponi      Medications reviewed & verified: Allergies - Medications - Substance & Sexual Activity History -      Specialty medication(s) & dose(s) confirmed: yes  Changes to medications: no  Tolerating medications:    Amount of medications patient has on hand:  none    CLINICAL ASSESSMENT     Sara Wagner reports tolerating Simponi well without adverse effects - declined further medication counseling.  Poor adherence with Simponi - last refill was end of October for a 1 month supply of Simponi.  [facility] unable to reach patient for refill and clinical assessment.  She missed 2-3 doses of Simponi to date.  Advised patient to return pharmacy's call for refill or she can call in directly in the future to avoid delay in med administration.  Patient is seen in clinic today with Sara Pavlov, PA - please refer to clinic notes for detailed clinical assessment of rheumatoid arthritis.  Resume Simponi at this time.  Simponi mfg assistance (pharmacy card) will expire 06/17/18 - will request MAP team to renew.     SHIPPING     ?? Shipping address verified with patient and Epic/WAM.  Expected medication delivery date: 04/21/18, via UPS.  Next dose of Simponi from this shipment due on the same day.  ?? Other medications/items to be shipping:  none    The patient will receive a print out for each medication shipped and additional FDA Medication Guides as required.  Patient education from [place] or Robet Leu may also be included in the shipment    All questions were answered and contact information provided for any future questions/concerns.      Jeneen Montgomery

## 2018-04-16 NOTE — Unmapped (Addendum)
Por favor llame a nuestra l??nea telef??nica en espa??ol con preguntas: [phone]    Recomiendo tomar suplementos de calcio y vitamina D para la salud ??sea. Usted debe tomar en 1,200 mg de calcio diario total de dieta y [employer] fuentes de suplemento contador. Usted debe tomar 2.000 unidades de vitamina D diariamente de suplementos de [address].       Patient Education        Fascitis plantar: Ejercicios - [ Plantar Fasciitis: Exercises ]  Instrucciones de cuidado  Aqu?? se presentan algunos ejemplos de ejercicios t??picos de rehabilitaci??n para tratar su afecci??n. Empiece cada ejercicio lentamente. Reduzca la intensidad del ejercicio si empieza a Surveyor, mining.  Su m??dico o fisioterapeuta le dir??n cu??ndo puede comenzar con estos ejercicios y cu??les funcionar??n mejor para usted.  C??mo hacer los ejercicios  Estiramiento con toalla   1. Si??ntese con las piernas extendidas y las rodillas rectas.  2. Coloque una toalla alrededor de su pie justo por debajo de los dedos.  3. Sostenga cada extremo de la toalla con cada mano, con las manos por encima de las rodillas.  4. Orland Penman atr??s con la toalla para que el pie se extienda hacia usted.  5. Mantenga la posici??n por lo menos de 15 a 30 segundos.  6. Repita 2 a 4 veces por sesi??n, hasta 5 veces al d??a.    Estiramiento de pantorrilla   1. P??ngase de pie frente a una pared, con las manos apoyadas en la pared a la altura de los ojos. Ponga la pierna que vaya a estirar un paso atr??s de la otra.  2. Manteniendo ese tal??n en el suelo, doble la pierna de adelante hasta que sienta el estiramiento en la pierna de atr??s.  3. Sostenga el estiramiento de 15 a 30 segundos. Repita de 2 a 4 veces.    Estiramiento de la fascia plantar y la pantorrilla   1. P??rese [employer], como se [place]. Aseg??rese de sujetarse al barandal (pasamanos).  2. Lentamente pose sus talones sobre el borde del escal??n a medida que [employer] m??sculos de la pantorrilla. Debe sentir un leve estiramiento en la parte inferior de su pie y en la parte de atr??s de su pierna hasta la rodilla.  3. Mantenga este estiramiento de 15 a 30 segundos y [employer] m??sculos de la pantorrilla un poco para llevar el tal??n hacia atr??s hasta el nivel del escal??n. Repita de 2 a 4 veces.    Flexiones con toalla   1. Estando sentado, coloque el pie sobre una toalla en el suelo y acerque la toalla hacia usted con los dedos del pie.  2. Tia Alert, usando tambi??n los dedos del pie, aleje la toalla de usted.    Recoger canicas   1. Ponga canicas en el suelo junto a una taza.  2. Usando los dedos del pie, trate de levantar las canicas del piso y ponerlas en la taza.    La atenci??n de seguimiento es una parte clave de su tratamiento y seguridad. Aseg??rese de hacer y acudir a todas las citas, y llame a su m??dico si est?? teniendo problemas. Tambi??n es una buena idea [employer] de sus ex??menes y [employer] de los medicamentos que toma.  ??D??nde puede encontrar m??s informaci??n en ingl??s?  Vaya a My[facility] en https://myuncchart.org  [facility] (Biblioteca de Salud) en el men?? Resources (Recursos). Marcelino Freestone U981 en la b??squeda para aprender m??s acerca de Fascitis plantar: Ejercicios - [  Plantar Fasciitis: Exercises ].  Revisado: 26 junio, 2019  Versi??n del contenido: 12.3  ?? 2006-2019 Healthwise, Incorporated. Las instrucciones de cuidado fueron adaptadas bajo licencia por [facility]. Si usted tiene preguntas sobre una afecci??n m??dica o sobre estas instrucciones, siempre pregunte a su profesional de salud. Healthwise, Incorporated niega toda garant??a o responsabilidad por su uso de esta informaci??n.

## 2018-04-16 NOTE — Unmapped (Signed)
REASON FOR VISIT: f/u RA     HISTORY: Sara Wagner is a 58 y.o. latin female (originally from [place]) with hx of seropositive (-RF, ++CCP) erosive RA dx in 2000. She established care with Dr Olean Ree in 2015. Previous therapies include mtx (unable to tolerate due to GI distress), leflunomide, HCQ, Enbrel (stopped due to move and transfer of care), Humira (stopped due to side effects and reported inefficacy). Simponi was recommended in 05/2015, but was not started until 06/2017.   Continues leflunomide and HCQ with some intermittent compliance, Simponi SQ, chronic prednisone 10 mg.   She has had intermittent f/u and compliance to medications, which has hindered her treatment.     Additional hx of osteoporosis on dexa done 05/2017, Tscore Lspine -2.8, femur neck -1.3. Started reclast in 12/2017.      Interim history:   Presents today for f/u.  [employer] interpre[employer] used for the duration of her visit.   Fill history indicates she has not refilled simponi since 11/2017.     She states she last received Simponi in December.  She reports being called by the pharmacy since that time to refill, but when she called back she was asked for a reference number, and she since she did not have a reference number was told that she could not refill her medication.  I talked with our pharmacist, neither of [place] are really sure what this means.    Today she reports pain in the right ankle and posterior right hip.  The hip pain bothers her mostly when rising from a seated position.  She also notes pain in the right heel which is no worse with weightbearing or rest, hurts the same all day long.  Morning stiffness lasting an hour or more.          CURRENT MEDICATIONS:  Current Outpatient Medications   Medication Sig Dispense Refill   ??? acetaminophen (TYLENOL) 325 MG tablet Take 2 tablets (650 mg total) by mouth Every six (6) hours. 30 tablet 1   ??? albuterol (PROVENTIL HFA;VENTOLIN HFA) 90 mcg/actuation inhaler Inhale 2 puffs every four (4) hours as needed for wheezing.     ??? albuterol 2.5 mg /3 mL (0.083 %) nebulizer solution Inhale 2.5 mg by nebulization every four (4) hours as needed for wheezing.     ??? amLODIPine (NORVASC) 10 MG tablet Take 10 mg by mouth daily.     ??? calcium carbonate-vitamin D3 600 mg(1,500mg ) -400 unit Chew Chew 1 capsule Two (2) times a day (at 8am and 12:00). 1 tablet 1   ??? cholecalciferol, vitamin D3, 50,000 unit Tab tablet Take 1 tablet (50,000 Units total) by mouth every seven (7) days. 8 tablet 0   ??? cyclobenzaprine (FLEXERIL) 5 MG tablet Take 10 mg at bedtime 60 tablet 3   ??? empty container (BD SHARPS COLLECTOR) Misc 1 each by Miscellaneous route once as needed. for up to 1 dose 1 each 3   ??? fluticasone-salmeterol (ADVAIR) 500-50 mcg/dose diskus Inhale 1 puff Two (2) times a day. 1 each 6   ??? golimumab (SIMPONI) 50 mg/0.5 mL PnIj pen injector INJECT THE CONTENTS OF 1 AUTOINJECTOR (50MG ) UNDER THE SKIN EVERY 28 DAYS 1 mL 9   ??? hydroxychloroquine (PLAQUENIL) 200 mg tablet Take 1 tablet (200 mg total) by mouth Two (2) times a day. 60 tablet 11   ??? leflunomide (ARAVA) 20 MG tablet Take 1 tablet (20 mg total) by mouth daily. 30 tablet 3   ??? lisinopril (PRINIVIL,ZESTRIL) 20 MG  tablet Take 40 mg by mouth daily.      ??? montelukast (SINGULAIR) 10 mg tablet Take 1 tablet (10 mg total) by mouth nightly. 30 tablet 8   ??? omeprazole (PRILOSEC) 20 MG capsule Take 20 mg by mouth daily.     ??? predniSONE (DELTASONE) 5 MG tablet Take 2 tablets (10 mg total) by mouth daily. 60 tablet 3   ??? traZODone (DESYREL) 50 MG tablet Take 50 mg by mouth nightly.     ??? golimumab (SIMPONI) 50 mg/0.5 mL PnIj pen injector INJECT THE CONTENTS OF 1 AUTOINJECTOR (50MG ) UNDER THE SKIN EVERY 28 DAYS (Patient not taking: Reported on 04/16/2018) 1 mL 9   ??? zoledronic acid-mannitol&water (RECLAST) 5 mg/100 mL PgBk Infuse 5 mg into a venous catheter once. Once yearly       No current facility-administered medications for this visit.        Past Medical History: Diagnosis Date   ??? Asthma, extrinsic    ??? GERD (gastroesophageal reflux disease)    ??? HTN (hypertension)    ??? Rheumatoid arthritis(714.0)    ??? Situational depression         Record Review: Available records were reviewed, including pertinent office visits, labs, and imaging.      REVIEW OF SYSTEMS: Ten system were reviewed and negative except as noted above.    PHYSICAL EXAM:  VITAL SIGNS:   Vitals:    04/16/18 1427   BP: 148/70   BP Site: L Arm   BP Position: Sitting   BP Cuff Size: Medium   Pulse: 76   Temp: 36 ??C (96.8 ??F)   TempSrc: Oral   Weight: 70.4 kg (155 lb 3.3 oz)     General:   Pleasant 58 y.o.female in no acute distress, WDWN   Eyes:   PERRL, conjunctiva and sclera not inflamed. Tears appear adequate.    ENT:   No oropharyngeal lesions. Mucous membranes moist.    Lymph:   No masses or cervical lymphadenopathy.    Cardiovascular:  Regular rate and rhythm. No murmur, rub, or gallop. No lower extremity edema.    Lungs:  Clear to auscultation.Normal respiratory effort.    Musculoskeletal:   General: Ambulates w/o assistance   Hands: Early swan-neck deformities throughout.  Bony enlargement of scattered MCPs.  Synovitis of fifth MCP, third and fifth PIP on the right.  Synovitis of MCP 2???3 and PIP 2???4 on the left.  Unable to make a tight fist bilaterally.  Wrists: Nearly fixed range of motion of the right with synovitis.  Reduced range of motion of the left.  Elbows: Full range of motion without pain  Shoulders: Full range of motion without pain  Hips: Full flexion  Knees: Full range motion with crepitus  Ankles: Warm effusions bilaterally with tenderness of the right  Feet: No pain with MTP squeeze.  Tenderness over plantar fascial insertion on the right.  Pes planus bilaterally   Neurological:  CN 2-12 grossly intact. 5/5 strength on extremities.   Psych:  Appropriate affect and mood   Skin:  No rashes.         ASSESSMENT/PLAN:  1. Seropositive rheumatoid arthritis (CMS-HCC)  With continued synovitis on exam in the setting of poor compliance with Simponi.  We discussed methods for improving compliance, including returning calls to pharmacy and calling our clinic if she is unable to receive refills from the pharmacy.  She was given the number to our Spanish phone line.  She will continue leflunomide 20  mg daily, Plaquenil 200 mg twice daily, prednisone 10 mg daily.  She will resume Simponi monthly injections whenever possible.  She met with our pharmacist to assist in resuming Simponi injections.  - predniSONE (DELTASONE) 5 MG tablet; Take 2 tablets (10 mg total) by mouth daily.  Dispense: 60 tablet; Refill: 3    2. Encounter for monitoring leflunomide therapy  Reviewed results of labs done earlier today, stable.    3. Age-related osteoporosis without current pathological fracture  Noted on DEXA from 05/2017, T score L spine -2.8, femur neck -1.3.  Received Reclast x1 in November 2019, plan for 2 more yearly doses followed by consideration of bisphosphonate holiday.  Ideally would like to be able to taper her off prednisone, but this has been hindered by her lack of compliance to DMARD medications.  She was asked to start calcium and vitamin D supplements.  - Vitamin D 25 Hydroxy (25OH D2 + D3); Future  - Calcium; Future    4.  Plantar fasciitis  Suspect this may be resulting from her flat feet.  Encouraged her to wear supportive shoes when standing and walking.  Exercises rendered to be done for plantar fasciitis.        HCM:   - PCV13 Status: 06/09/15  - PPSV 23 Status: 01/02/17  - Annual Influenza vaccine. Status: 01/02/17  - Bone health: start calcium and vitamin D supplements while taking chronic prednisone.  Recommend calcium 1200 mg in diet and supplements combined, vitamin D 2000 units and supplements alone daily.  - Plaquenil eye exam: 07/21/17  - Contraception: postmenopausal since age 80       Return appointment in 3 mo with myself and 6 mo with Dr Olean Ree     25 minutes was spent with the patient, over half of which was counseling regarding dx and treatment plan.

## 2018-04-17 LAB — VITAMIN D, TOTAL (25OH): Lab: 27.6

## 2018-04-21 ENCOUNTER — Ambulatory Visit
Admit: 2018-04-21 | Discharge: 2018-04-21 | Attending: Physical Medicine & Rehabilitation | Primary: Physical Medicine & Rehabilitation

## 2018-04-21 ENCOUNTER — Ambulatory Visit: Admit: 2018-04-21 | Discharge: 2018-04-21

## 2018-04-21 DIAGNOSIS — M7061 Trochanteric bursitis, right hip: Principal | ICD-10-CM

## 2018-04-21 DIAGNOSIS — M25551 Pain in right hip: Principal | ICD-10-CM

## 2018-04-21 DIAGNOSIS — M069 Rheumatoid arthritis, unspecified: Principal | ICD-10-CM

## 2018-04-21 DIAGNOSIS — M5432 Sciatica, left side: Principal | ICD-10-CM

## 2018-04-21 DIAGNOSIS — I10 Essential (primary) hypertension: Principal | ICD-10-CM

## 2018-04-21 DIAGNOSIS — Z79899 Other long term (current) drug therapy: Principal | ICD-10-CM

## 2018-04-21 DIAGNOSIS — K219 Gastro-esophageal reflux disease without esophagitis: Principal | ICD-10-CM

## 2018-04-21 NOTE — Unmapped (Addendum)
Dear Ms. Mudrick,    Thank you for coming to see [place] today. It was a pleasure to see you. This summary reviews the goals and plans we discussed at your visit today.     Below, you will see: a) your working diagnosis b) your treatment plan and c) your next steps and followup plan.    We care about your quality of life and are committed to helping optimize your functionality.     DIAGNOSIS:   1. Left sided sciatica    2. Right hip pain    3. Greater trochanteric bursitis of right hip       TREATMENT PLAN:   - Right hip X-ray ordered to evaluate for any bony abnormalities that could be contributing to your pain  - Increase Tylenol intake to 1,000 mg three times per day (do not take over 3,000 mg per day)  - Continue home exercise program as tolerated    NEXT STEPS/FOLLOW UP:   - Follow up in as needed to reassess your pain  - We will call you once x-ray is performed to discuss further options for treatment as indicated.     You may contact me through MyChart of call our office with any questions.     If you develop any red flag signs such as new or progressive motor weakness, sensory deficits, saddle anesthesia (groin numbness), bowel/bladder dysfunction, gait/coordination disturbance, weight loss, or night pain, you should call our office and/or seek prompt evaluation at the nearest ER.    All my best,    Asa Saunas, MD  Assistant Professor - PM&R  Interventional Spine Specialist - [facility]  Staff Physiatrist [facility]   [place] [facility] [place]

## 2018-04-21 NOTE — Unmapped (Addendum)
[facility] Physical Medicine and Rehabilitation     Patient Name:Sara Wagner  MRN: [id number]  DOB: 12/25/1960  Age: 58 y.o.   Encounter Date: 04/21/2018     ASSESSMENT & PLAN:     04/21/2018: Sara Wagner is a 58 year old female with history of rheumatoid arthritis follows with [facility] Rheumatology referred to spine center for evaluation of left-sided sciatic pain.  Patient reports that since she had last seen her rheumatologist her left-sided radicular symptoms have improved in regards to pain with home exercise program, though patient still endorsing left calf and foot numbness.  Today, patient most concerned about right hip pain radiating into the groin with onset 3 months ago.  No history of trauma or injury.  Range of motion is adequate in right hip compared to the left, though significant groin pain elicited with FABER test on the right.  There is point tenderness over greater trochanter on the right as well. Given location and severity of the pain, we recommend x-rays of the right hip to evaluate for bony abnormalities. Would also consider formal physical therapy referral to focus on mechanical diagnosis and therapy of lumbar spine but unfortunately patient does not have insurance or charity care with [facility] (reportedly denied for CC) and is unable to afford appointments at this time.  Discussed with patient that we will contact her once right hip x-rays are performed and discuss further options for treatment at that time.     DIAGNOSIS:   1. Left sided sciatica    2. Right hip pain    3. Greater trochanteric bursitis of right hip      TREATMENT PLAN:   - Right hip X-ray ordered to evaluate for any bony abnormalities that could be contributing to your pain  - Increase Tylenol intake to 1,000 mg three times per day (do not take over 3,000 mg per day)  - Continue home exercise program as tolerated    NEXT STEPS/FOLLOW UP:   - Follow up in as needed to reassess your pain  - Will call the patient once x-ray is performed to discuss further options for treatment    Comprehensive patient education performed today explaining that the care plan will integrate multimodal activity-based rehabilitation techniques to enhance recovery, reduce pain, and facilitate functionality. Risks, benefits, and instructions for all medications prescribed / treatments offered  reviewed extensively with patient, who expresses understanding.  Patient strongly advised regarding red flag signs such as new or progressive motor weakness, sensory deficits, saddle anesthesia, bowel/bladder dysfunction, gait/coordination disturbance, weight loss, and night pain that should prompt evaluation at nearest ER.    A consultation report has been transmitted to the consult requesting physician.    SUBJECTIVE:     Chief Complaint:    Hip Pain    History of Present Illness:   Sara Wagner is a 58 y.o. female being seen in consultation for Clearnce Hasten, MD  for evaluation of  Hip Pain    Today, 04/21/2018: Patient was referred for left-sided sciatica. She previously reported left-sided neck pain and left-sided low back pain with radiation to her left leg.  She was seen by her rheumatologist in October and was referred to [facility] spine center for further evaluation of left-sided radicular symptoms.  Since that time, patient has been utilizing home exercise program and left-sided radicular pain symptoms have resolved though there is residual numbness present in the lateral calf and foot.  She was referred for formal physical therapy evaluation but did not receive a call  to set up an also does not have insurance for charity care with [facility] saying that she was denied charity care here.  Mrs. made it difficult for her to set up appointments due to cost limitation.  Today, patient reports right hip pain radiating to the groin that started 3 months ago.  Denies any trauma or injury preceding this.  States she has been taking Tylenol about 3 times weekly to assist with pain relief as well as Advil occasionally up to twice weekly.   There is no spine imaging available for review today.     Symptom Location: right hip with radiation to right groin and lower abdomen.  Also reports left leg numbness that has been present for over 6 months.   Symptom Onset/Mechanism: ongoing for >6 months with left leg numbness, right hip pain for ~3 months  Symptom Character: sharp, intermittent in right hip, numbness in left leg  Aggravating Factors: right hip worse with forward bending, cannot stand up quickly due to pain and stiffness  Alleviating Factors: rest, pain medications   Past Treatments: Tylenol 650 mg up to 3 times weekly or Advil 500 mg up to 2 times weekly   Associated Symptoms: low back muscle spasms and stiffness  Temporal Pattern: intermittent  NRS Pain Intensity: 9/10  Night Pain: no  Unintended Weight Loss: no  Fever/Infection: no  Neuromotor Function: motor weakness - (no), gait/coordination disturbance - (no), loss of bowel or bladder control - (no), saddle anesthesia - (no)     Current Outpatient Medications   Medication Sig Dispense Refill   ??? acetaminophen (TYLENOL) 325 MG tablet Take 2 tablets (650 mg total) by mouth Every six (6) hours. 30 tablet 1   ??? albuterol (PROVENTIL HFA;VENTOLIN HFA) 90 mcg/actuation inhaler Inhale 2 puffs every four (4) hours as needed for wheezing.     ??? albuterol 2.5 mg /3 mL (0.083 %) nebulizer solution Inhale 2.5 mg by nebulization every four (4) hours as needed for wheezing.     ??? amLODIPine (NORVASC) 10 MG tablet Take 10 mg by mouth daily.     ??? calcium carbonate-vitamin D3 600 mg(1,500mg ) -400 unit Chew Chew 1 capsule Two (2) times a day (at 8am and 12:00). 1 tablet 1   ??? cholecalciferol, vitamin D3, 50,000 unit Tab tablet Take 1 tablet (50,000 Units total) by mouth every seven (7) days. 8 tablet 0   ??? cyclobenzaprine (FLEXERIL) 5 MG tablet Take 10 mg at bedtime 60 tablet 3   ??? empty container (BD SHARPS COLLECTOR) Misc 1 each by Miscellaneous route once as needed. for up to 1 dose 1 each 3   ??? fluticasone-salmeterol (ADVAIR) 500-50 mcg/dose diskus Inhale 1 puff Two (2) times a day. 1 each 6   ??? golimumab (SIMPONI) 50 mg/0.5 mL PnIj pen injector INJECT THE CONTENTS OF 1 AUTOINJECTOR (50MG ) UNDER THE SKIN EVERY 28 DAYS 1 mL 9   ??? golimumab (SIMPONI) 50 mg/0.5 mL PnIj pen injector INJECT THE CONTENTS OF 1 AUTOINJECTOR (50MG ) UNDER THE SKIN EVERY 28 DAYS 1 mL 9   ??? hydroxychloroquine (PLAQUENIL) 200 mg tablet Take 1 tablet (200 mg total) by mouth Two (2) times a day. 60 tablet 11   ??? leflunomide (ARAVA) 20 MG tablet Take 1 tablet (20 mg total) by mouth daily. 30 tablet 3   ??? lisinopril (PRINIVIL,ZESTRIL) 20 MG tablet Take 40 mg by mouth daily.      ??? montelukast (SINGULAIR) 10 mg tablet Take 1 tablet (10 mg total) by mouth  nightly. 30 tablet 8   ??? omeprazole (PRILOSEC) 20 MG capsule Take 20 mg by mouth daily.     ??? predniSONE (DELTASONE) 5 MG tablet Take 2 tablets (10 mg total) by mouth daily. 60 tablet 3   ??? traZODone (DESYREL) 50 MG tablet Take 50 mg by mouth nightly.     ??? zoledronic acid-mannitol&water (RECLAST) 5 mg/100 mL PgBk Infuse 5 mg into a venous catheter once. Once yearly       No current facility-administered medications for this visit.        Allergies:   Codeine and Morphine    Past Medical / Surgical History:     Past Medical History:   Diagnosis Date   ??? Asthma, extrinsic    ??? GERD (gastroesophageal reflux disease)    ??? HTN (hypertension)    ??? Rheumatoid arthritis(714.0)    ??? Situational depression      Patient Active Problem List    Diagnosis Date Noted   ??? Right hip pain 04/21/2018   ??? Greater trochanteric bursitis of right hip 04/21/2018   ??? Age-related osteoporosis without current pathological fracture 11/27/2017   ??? Left sided sciatica 11/27/2017   ??? Pelvic mass in female 03/18/2016   ??? Situational depression    ??? HTN (hypertension)    ??? Asthma, extrinsic    ??? Fatigue 06/09/2015   ??? Iron deficiency anemia 06/09/2015   ??? Mild intermittent asthma 10/26/2014   ??? 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 RA (rheumatoid arthritis) (CMS-HCC) 08/25/2013     Past Surgical History:   Procedure Laterality Date   ??? CESAREAN SECTION     ??? LAPAROSCOPIC CHOLECYSTECTOMY     ??? MYOMECTOMY VAGINAL APPROACH     ??? PR EXPLORATORY OF ABDOMEN N/A 03/18/2016    Procedure: Exploratory Laparotomy, Exploratory Celiotomy With Or Without Biopsy(S);  Surgeon: Verlin Dike, MD;  Location: MAIN OR [facility];  Service: Gynecology Oncology   ??? PR LYSIS ADNEXAL ADHESIONS N/A 03/18/2016    Procedure: Lysis Of Adhesions (Salpingolysis, Ovariolysis);  Surgeon: Verlin Dike, MD;  Location: MAIN [facility];  Service: Gynecology Oncology   ??? PR RELEASE URETER,RETROPER FIBROSIS Right 03/18/2016    Procedure: Ureterolysis, With Or Without Repositioning Of Ureter For Retroperitoneal Fibrosis;  Surgeon: Verlin Dike, MD;  Location: MAIN OR [facility];  Service: Gynecology Oncology   ??? PR TOTAL ABDOM HYSTERECTOMY Bilateral 03/18/2016    Procedure: Total Abdominal Hysterectomy (Corpus & Cervix), W/Wo Removal Of Tube(S), W/Wo Removal Of Ovary(S);  Surgeon: Verlin Dike, MD;  Location: MAIN [facility];  Service: Gynecology Oncology       Social History     Socioeconomic History   ??? Marital status: Single     Spouse name: Not on file   ??? Number of children: Not on file   ??? Years of education: Not on file   ??? Highest education level: Not on file   Occupational History   ??? Not on file   Social Needs   ??? Financial resource strain: Not on file   ??? Food insecurity     Worry: Not on file     Inability: Not on file   ??? Transportation needs     Medical: Not on file     Non-medical: Not on file   Tobacco Use   ??? Smoking status: Never Smoker   ??? Smokeless tobacco: Never Used   Substance and Sexual Activity   ??? Alcohol use: No     Alcohol/week: 0.0  standard drinks   ??? Drug use: No   ??? Sexual activity: Never   Lifestyle   ??? Physical activity     Days per week: Not on file     Minutes per session: Not on file   ??? Stress: Not on file   Relationships   ??? Social Wellsite geologist on phone: Not on file     Gets together: Not on file     Attends religious service: Not on file     Active member of club or organization: Not on file     Attends meetings of clubs or organizations: Not on file     Relationship status: Not on file   Other Topics Concern   ??? Not on file   Social History Narrative    Mrs. Maldonaldo is a separated G2 P1, ectopic 1.  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 Works there as often as she is able to. Her daughter, Raynelle Fanning, speaks [place] fluently.       Family History   Problem Relation Age of Onset   ??? Breast cancer Sister    ??? Colon cancer Sister         died at age 62   ??? Breast cancer Sister        For any of the above entries which indicate no records on file, the patient reports no relevant history.                 Review of Systems:   All systems reviewed and negative other than as noted in the HPI.  Patient has been instructed to followup with PCP or appropriate specialist as required for symptoms outside the purview of this speciality.    OBJECTIVE:     Vitals:     BP 194/94  - Pulse 80  - Temp 37.6 ??C  - Wt 70.2 kg (154 lb 12.8 oz)  - BMI 29.25 kg/m??     The above medications, allergies, history, and ROS, and vitals have been reviewed.    Physical Exam:   GEN: alert and oriented, no apparent distress, well developed, Body mass index is 29.25 kg/m??.  HEENT: normocephalic, atraumatic, anicteric, moist mucous membranes  CV: warm, well perfused  PULM: chest symmetric, normal work of breathing  EXT: no swelling, no edema   SKIN: no visible ecchymosis, no skin breakdown  PSYCH: normal mood and affect  MSK/NEURO:   Inspection   Ambulates without assistive device     Palpation  TTP over L5 right paraspinals  Sensation to light touch intact bilaterally, except diminished to LT in left lateral calf and dorsal aspect of foot    Range of Motion   Low Back   Lumbar flexion to 60 degrees without pain   Lumbar Extension to 25 degrees without pain   Lateral bend to 25 degrees without pain      Left Lower Extremity Right Lower Extremity   Hip Flexion  5/5 4+/5 (pain limited)   Hip Abduction  5/5 4+/5 (pain limited)   Hip Adduction  5/5 5/5   Knee Extension  5/5 4+/5 (pain limited)   Ankle Dorsiflexion  5/5 5/5   Ankle Plantarflexion  5/5 5/5   EHL Extension  5/5 5/5     Reflexes    Left Lower Extremity Right Lower Extremity    Patellar 2+ 2+   Achilles  1+ 1+  Ankle Clonus none none     Back/Lower Extremities:   Straight Leg Raise: LLE: (-) negative, RLE: (-) negative  Lumbar Facet Loading: Left: (-) negative, Right: (-) negative  FABER: LLE: (-) negative, RLE: (+) positive   FADIR:  LLE: (-) negative, RLE: (-) negative    W. Clayton Bibles DO, MBA   PGY-2, [facility] PM&R    ATTENDING PHYSICIAN ATTESTATION:     I saw and evaluated the patient, participating in the key portions of the service.  I reviewed the resident???s note, editing it as needed.  I agree with the resident???s findings and plan.    I was present for the entirety of any procedures performed.     Asa Saunas, MD  Assistant Professor - PM&R  Interventional Spine Specialist - [facility]  Staff Physiatrist [facility]   [place]

## 2018-04-22 NOTE — Unmapped (Signed)
Pain resolved with residual left calf/foot numbness

## 2018-04-28 NOTE — Unmapped (Signed)
Daughter called stating medication (Simponi)  was ordered about 2 weeks ago and had not been received. In looking at chart, multiple calls were made by [facility] and CPP with no answer or return calls. Reiterated to patient's daughter importance of answering/returning calls and re-explained process of verifying delivery date prior to shipping etc. . When asked, patient's daughter verified her mother has been without medication for 2 months or possibly longer. Patient's daughter mentioned her work schedule and mother's inability to communicate well in [place] as reasons for messages not being returned. She was informed we use an interpreter service to help (to which she stated an interpreter was used at one of their previous office visits and did not meet her expectations). Messaged MD and informed patient's daughter we would follow up with her as soon as we hear back from the provider (in case patient needs to be seen first etc.).

## 2018-04-29 NOTE — Unmapped (Signed)
[facility] Clinical Assessment & Refill Coordination Note    Sara Wagner, DOB: Jan 15, 1961  Phone: [phone] (home)     All above HIPAA information was verified with patient's family member.     Specialty Medication(s):   Inflammatory Disorders: Simponi     Current Outpatient Medications   Medication Sig Dispense Refill   ??? acetaminophen (TYLENOL) 325 MG tablet Take 2 tablets (650 mg total) by mouth Every six (6) hours. 30 tablet 1   ??? albuterol (PROVENTIL HFA;VENTOLIN HFA) 90 mcg/actuation inhaler Inhale 2 puffs every four (4) hours as needed for wheezing.     ??? albuterol 2.5 mg /3 mL (0.083 %) nebulizer solution Inhale 2.5 mg by nebulization every four (4) hours as needed for wheezing.     ??? amLODIPine (NORVASC) 10 MG tablet Take 10 mg by mouth daily.     ??? calcium carbonate-vitamin D3 600 mg(1,500mg ) -400 unit Chew Chew 1 capsule Two (2) times a day (at 8am and 12:00). 1 tablet 1   ??? cholecalciferol, vitamin D3, 50,000 unit Tab tablet Take 1 tablet (50,000 Units total) by mouth every seven (7) days. 8 tablet 0   ??? cyclobenzaprine (FLEXERIL) 5 MG tablet Take 10 mg at bedtime 60 tablet 3   ??? empty container (BD SHARPS COLLECTOR) Misc 1 each by Miscellaneous route once as needed. for up to 1 dose 1 each 3   ??? fluticasone-salmeterol (ADVAIR) 500-50 mcg/dose diskus Inhale 1 puff Two (2) times a day. 1 each 6   ??? golimumab (SIMPONI) 50 mg/0.5 mL PnIj pen injector INJECT THE CONTENTS OF 1 AUTOINJECTOR (50MG ) UNDER THE SKIN EVERY 28 DAYS 1 mL 9   ??? golimumab (SIMPONI) 50 mg/0.5 mL PnIj pen injector INJECT THE CONTENTS OF 1 AUTOINJECTOR (50MG ) UNDER THE SKIN EVERY 28 DAYS 1 mL 9   ??? hydroxychloroquine (PLAQUENIL) 200 mg tablet Take 1 tablet (200 mg total) by mouth Two (2) times a day. 60 tablet 11   ??? leflunomide (ARAVA) 20 MG tablet Take 1 tablet (20 mg total) by mouth daily. 30 tablet 3   ??? lisinopril (PRINIVIL,ZESTRIL) 20 MG tablet Take 40 mg by mouth daily.      ??? montelukast (SINGULAIR) 10 mg tablet Take 1 tablet (10 mg total) by mouth nightly. 30 tablet 8   ??? omeprazole (PRILOSEC) 20 MG capsule Take 20 mg by mouth daily.     ??? predniSONE (DELTASONE) 5 MG tablet Take 2 tablets (10 mg total) by mouth daily. 60 tablet 3   ??? traZODone (DESYREL) 50 MG tablet Take 50 mg by mouth nightly.     ??? zoledronic acid-mannitol&water (RECLAST) 5 mg/100 mL PgBk Infuse 5 mg into a venous catheter once. Once yearly       No current facility-administered medications for this visit.         Changes to medications: [name] reports no changes reported at this time.    Allergies   Allergen Reactions   ??? Codeine Other (See Comments)     vomiting   ??? Morphine Other (See Comments)     vomiting       Changes to allergies: No    SPECIALTY MEDICATION ADHERENCE     Simponi 100 mg/ml: 0 days of medicine on hand     Medication Adherence    Patient reported X missed doses in the last month:  all  Specialty Medication:  Simponi 50mg /0.33ml - pt has been without medication since fall due to not returning calls  to schedule delivery; MD aware and says to restart ASAP  Support network for adherence:  family member          Specialty medication(s) dose(s) confirmed: Regimen is correct and unchanged.     Are there any concerns with adherence? Not any longer now that the patient's daughter understands the pharmacy process and will be helping to manage her mother's medicaitons    Adherence counseling provided? Not needed    CLINICAL MANAGEMENT AND INTERVENTION      Clinical Benefit Assessment:    Do you feel the medicine is effective or helping your condition? Yes    Clinical Benefit counseling provided? Not needed    Adverse Effects Assessment:    Are you experiencing any side effects? No    Are you experiencing difficulty administering your medicine? No    Quality of Life Assessment:    How many days over the past month did your rheumatoid arthritis keep you from your normal activities? For example, brushing your teeth or getting up in the morning. Patient declined to answer    Have you discussed this with your provider? Not needed    Therapy Appropriateness:    Is therapy appropriate? Yes, therapy is appropriate and should be continued    DISEASE/MEDICATION-SPECIFIC INFORMATION      For patients on injectable medications: Patient currently has 0 doses left.  Next injection is scheduled for ASAP (per MD).    PATIENT SPECIFIC NEEDS     ? Does the patient have any physical, cognitive, or cultural barriers? No    ? Is the patient high risk? No     ? Does the patient require a Care Management Plan? No     ? Does the patient require physician intervention or other additional services (i.e. nutrition, smoking cessation, social work)? No      SHIPPING     Specialty Medication(s) to be Shipped:   Inflammatory Disorders: Simponi 100mg /ml    Other medication(s) to be shipped: none       Changes to insurance: No    Delivery Scheduled: Yes, Expected medication delivery date: 04/30/2018.     Medication will be delivered via Same Day Courier to the confirmed home address in [facility].    The patient will receive a drug information handout for each medication shipped and additional FDA Medication Guides as required.  Verified that patient has previously received a Conservation officer, historic buildings.    Karene Fry Prentiss Polio   [facility] Specialty Pharmacist

## 2018-04-30 MED FILL — SIMPONI 50 MG/0.5 ML SUBCUTANEOUS PEN INJECTOR: 28 days supply | Qty: 0.5 | Fill #1

## 2018-04-30 MED FILL — SIMPONI 50 MG/0.5 ML SUBCUTANEOUS PEN INJECTOR: 28 days supply | Qty: 0 | Fill #1 | Status: AC

## 2018-05-07 NOTE — Unmapped (Signed)
Addended by: Murvin Natal on: 05/07/2018 04:42 PM     Modules accepted: Level of Service

## 2018-05-18 NOTE — Unmapped (Signed)
[facility] Shared [facility] Clinical Assessment & Refill Coordination Note    Sara Wagner, DOB: [birth year]  Phone: [phone] (home)     All above HIPAA information was verified with patient's family member.     Specialty Medication(s):   Inflammatory Disorders: Simponi     Current Outpatient Medications   Medication Sig Dispense Refill   ??? acetaminophen (TYLENOL) 325 MG tablet Take 2 tablets (650 mg total) by mouth Every six (6) hours. 30 tablet 1   ??? albuterol (PROVENTIL HFA;VENTOLIN HFA) 90 mcg/actuation inhaler Inhale 2 puffs every four (4) hours as needed for wheezing.     ??? albuterol 2.5 mg /3 mL (0.083 %) nebulizer solution Inhale 2.5 mg by nebulization every four (4) hours as needed for wheezing.     ??? amLODIPine (NORVASC) 10 MG tablet Take 10 mg by mouth daily.     ??? calcium carbonate-vitamin D3 600 mg(1,500mg ) -400 unit Chew Chew 1 capsule Two (2) times a day (at 8am and 12:00). 1 tablet 1   ??? cholecalciferol, vitamin D3, 50,000 unit Tab tablet Take 1 tablet (50,000 Units total) by mouth every seven (7) days. 8 tablet 0   ??? cyclobenzaprine (FLEXERIL) 5 MG tablet Take 10 mg at bedtime 60 tablet 3   ??? empty container (BD SHARPS COLLECTOR) Misc 1 each by Miscellaneous route once as needed. for up to 1 dose 1 each 3   ??? fluticasone-salmeterol (ADVAIR) 500-50 mcg/dose diskus Inhale 1 puff Two (2) times a day. 1 each 6   ??? golimumab (SIMPONI) 50 mg/0.5 mL PnIj pen injector INJECT THE CONTENTS OF 1 AUTOINJECTOR (50MG ) UNDER THE SKIN EVERY 28 DAYS 1 mL 9   ??? golimumab (SIMPONI) 50 mg/0.5 mL PnIj pen injector INJECT THE CONTENTS OF 1 AUTOINJECTOR (50MG ) UNDER THE SKIN EVERY 28 DAYS 1 mL 9   ??? hydroxychloroquine (PLAQUENIL) 200 mg tablet Take 1 tablet (200 mg total) by mouth Two (2) times a day. 60 tablet 11   ??? leflunomide (ARAVA) 20 MG tablet Take 1 tablet (20 mg total) by mouth daily. 30 tablet 3   ??? lisinopril (PRINIVIL,ZESTRIL) 20 MG tablet Take 40 mg by mouth daily.      ??? montelukast (SINGULAIR) 10 mg tablet Take 1 tablet (10 mg total) by mouth nightly. 30 tablet 8   ??? omeprazole (PRILOSEC) 20 MG capsule Take 20 mg by mouth daily.     ??? predniSONE (DELTASONE) 5 MG tablet Take 2 tablets (10 mg total) by mouth daily. 60 tablet 3   ??? traZODone (DESYREL) 50 MG tablet Take 50 mg by mouth nightly.     ??? zoledronic acid-mannitol&water (RECLAST) 5 mg/100 mL PgBk Infuse 5 mg into a venous catheter once. Once yearly       No current facility-administered medications for this visit.         Changes to medications: Sara Wagner reports no changes reported at this time.    Allergies   Allergen Reactions   ??? Codeine Other (See Comments)     vomiting   ??? Morphine Other (See Comments)     vomiting       Changes to allergies: No    SPECIALTY MEDICATION ADHERENCE     Simponi 50mg /0.24ml: 0 days of medicine on hand     Medication Adherence    Patient reported X missed doses in the last month:  0  Specialty Medication:  Simponi 50mg /0.47ml  Support network for adherence:  family member  Specialty medication(s) dose(s) confirmed: Regimen is correct and unchanged.     Are there any concerns with adherence? No    Adherence counseling provided? Not needed    CLINICAL MANAGEMENT AND INTERVENTION      Clinical Benefit Assessment:    Do you feel the medicine is effective or helping your condition? Yes    Clinical Benefit counseling provided? Not needed    Adverse Effects Assessment:    Are you experiencing any side effects? No    Are you experiencing difficulty administering your medicine? No    Quality of Life Assessment:    How many days over the past month did your rheumatoid arthritis keep you from your normal activities? For example, brushing your teeth or getting up in the morning. Patient declined to answer    Have you discussed this with your provider? Not needed    Therapy Appropriateness:    Is therapy appropriate? Yes, therapy is appropriate and should be continued    DISEASE/MEDICATION-SPECIFIC INFORMATION For patients on injectable medications: Patient currently has 0 doses left.  Next injection is scheduled for 05/28/2018.    PATIENT SPECIFIC NEEDS     ? Does the patient have any physical, cognitive, or cultural barriers? No    ? Is the patient high risk? No     ? Does the patient require a Care Management Plan? No     ? Does the patient require physician intervention or other additional services (i.e. nutrition, smoking cessation, social work)? No      SHIPPING     Specialty Medication(s) to be Shipped:   Inflammatory Disorders: Simponi 50mg /0.73ml    Other medication(s) to be shipped: none       Changes to insurance: No    Delivery Scheduled: Yes, Expected medication delivery date: 05/25/2018.     Medication will be delivered via Same Day Courier to the confirmed home address in [facility]    The patient will receive a drug information handout for each medication shipped and additional FDA Medication Guides as required.  Verified that patient has previously received a Conservation officer, historic buildings.    Karene Fry Tres Grzywacz   [facility] Shared [facility] Specialty Pharmacist

## 2018-05-22 MED FILL — SIMPONI 50 MG/0.5 ML SUBCUTANEOUS PEN INJECTOR: 28 days supply | Qty: 0 | Fill #2 | Status: AC

## 2018-05-22 MED FILL — SIMPONI 50 MG/0.5 ML SUBCUTANEOUS PEN INJECTOR: 28 days supply | Qty: 0.5 | Fill #2

## 2018-06-22 NOTE — Unmapped (Signed)
The [facility] has made a 2nd and final attempt to reach this patient to refill the following medication:Simponi.      We have left voicemails on the following phone numbers: [phone].    Dates contacted: 04/28, 05/04  Last scheduled delivery: 04/03    The patient may be at risk of non-compliance with this medication. The patient should call the [facility] at [phone] (option 4) to refill medication.    Olga Millers   [facility] Specialty Technician

## 2018-07-06 NOTE — Unmapped (Signed)
[facility] Refill Coordination Note    Specialty Medication(s) to be Shipped:   Inflammatory Disorders: SIMPONI 50 mg/0.5 mL       Sara Wagner, DOB: 1960/07/31  Phone: [phone] (home)       All above HIPAA information was verified with patient.     Completed refill call assessment today to schedule patient's medication shipment from the [facility] [phone]).       Specialty medication(s) and dose(s) confirmed: Regimen is correct and unchanged.   Changes to medications: Sara Wagner reports no changes at this time.  Changes to insurance: No  Questions for the pharmacist: No    Confirmed patient received Welcome Packet with first shipment. The patient will receive a drug information handout for each medication shipped and additional FDA Medication Guides as required.       DISEASE/MEDICATION-SPECIFIC INFORMATION        For patients on injectable medications: Patient currently has 0 doses left.  Next injection is scheduled for 07/01/2018 .Missed dose     SPECIALTY MEDICATION ADHERENCE     Medication Adherence    Patient reported X missed doses in the last month:  1  Specialty Medication:  SIMPONI 50 mg/0.5 mL  Patient is on additional specialty medications:  No  Patient is on more than two specialty medications:  No  Any gaps in refill history greater than 2 weeks in the last 3 months:  no  Informant:  child/children  Reliability of informant:  fairly reliable  Support network for adherence:  family member  Confirmed plan for next specialty medication refill:  delivery by pharmacy          Refill Coordination    Has the Patients' Contact Information Changed:  No  Is the Shipping Address Different:  No           SIMPONI 50 mg/0.5 mL  : 0 days of medicine on hand       SHIPPING     Shipping address confirmed in Epic.     Delivery Scheduled: Yes, Expected medication delivery date: 07/07/2018.     Medication will be delivered via Next Day Courier to the prescription address in [place].    Sara Wagner   [facility] Shared [facility] Specialty Technician

## 2018-07-06 NOTE — Unmapped (Signed)
Sara Wagner 's Simponi shipment will be delayed due to pending mfg assitance We have contacted the patient and left a message We will call the patient to reschedule the delivery upon resolution.

## 2018-07-21 NOTE — Unmapped (Signed)
[facility] Refill Coordination Note    Specialty Medication(s) to be Shipped:   Inflammatory Disorders: Simponi 50mg /0.52ml    Other medication(s) to be shipped: none       Sara Wagner, DOB: November 13, 1960  Phone: [phone] (home)       All above HIPAA information was verified with patient's family member. I spoke with Sara Wagner daughter, on the phone call today.    Completed refill call assessment today to schedule patient's medication shipment from the [facility] [phone]).       Specialty medication(s) and dose(s) confirmed: Regimen is correct and unchanged.   Changes to medications: Sara Wagner reports no changes at this time.  Changes to insurance: No  Questions for the pharmacist: No    Confirmed patient received Welcome Packet with first shipment. The patient will receive a drug information handout for each medication shipped and additional FDA Medication Guides as required.       DISEASE/MEDICATION-SPECIFIC INFORMATION        For patients on injectable medications: Patient currently has 0 doses left.  Next injection is scheduled for ASAP.    SPECIALTY MEDICATION ADHERENCE     Medication Adherence    Patient reported X missed doses in the last month:  all  Specialty Medication:  Simponi 50mg /0.60ml  Support network for adherence:  family member          Simponi 50mg /0.26ml: 0 days of medicine on hand       SHIPPING     Shipping address confirmed in Epic.     Delivery Scheduled: Yes, Expected medication delivery date: 07/22/2018.     Medication will be delivered via Same Day Courier to the home address in [place].    Sara Wagner   [facility] Specialty Pharmacist

## 2018-07-22 MED FILL — SIMPONI 50 MG/0.5 ML SUBCUTANEOUS PEN INJECTOR: 28 days supply | Qty: 0 | Fill #3 | Status: AC

## 2018-07-22 MED FILL — SIMPONI 50 MG/0.5 ML SUBCUTANEOUS PEN INJECTOR: 28 days supply | Qty: 0.5 | Fill #3

## 2018-08-12 NOTE — Unmapped (Signed)
[facility] Shared [facility] Clinical Assessment & Refill Coordination Note    Bela Bonaparte, DOB: Apr 15, 1960  Phone: [phone] (home)     All above HIPAA information was verified with patient's family member.  I spoke with [name] daughter, on today's phone call.    Specialty Medication(s):   Inflammatory Disorders: Simponi     Current Outpatient Medications   Medication Sig Dispense Refill   ??? acetaminophen (TYLENOL) 325 MG tablet Take 2 tablets (650 mg total) by mouth Every six (6) hours. 30 tablet 1   ??? albuterol (PROVENTIL HFA;VENTOLIN HFA) 90 mcg/actuation inhaler Inhale 2 puffs every four (4) hours as needed for wheezing.     ??? albuterol 2.5 mg /3 mL (0.083 %) nebulizer solution Inhale 2.5 mg by nebulization every four (4) hours as needed for wheezing.     ??? amLODIPine (NORVASC) 10 MG tablet Take 10 mg by mouth daily.     ??? calcium carbonate-vitamin D3 600 mg(1,500mg ) -400 unit Chew Chew 1 capsule Two (2) times a day (at 8am and 12:00). 1 tablet 1   ??? cholecalciferol, vitamin D3, 50,000 unit Tab tablet Take 1 tablet (50,000 Units total) by mouth every seven (7) days. 8 tablet 0   ??? cyclobenzaprine (FLEXERIL) 5 MG tablet Take 10 mg at bedtime 60 tablet 3   ??? empty container (BD SHARPS COLLECTOR) Misc 1 each by Miscellaneous route once as needed. for up to 1 dose 1 each 3   ??? fluticasone-salmeterol (ADVAIR) 500-50 mcg/dose diskus Inhale 1 puff Two (2) times a day. 1 each 6   ??? golimumab (SIMPONI) 50 mg/0.5 mL PnIj pen injector INJECT THE CONTENTS OF 1 AUTOINJECTOR (50MG ) UNDER THE SKIN EVERY 28 DAYS 1 mL 9   ??? golimumab (SIMPONI) 50 mg/0.5 mL PnIj pen injector INJECT THE CONTENTS OF 1 AUTOINJECTOR (50MG ) UNDER THE SKIN EVERY 28 DAYS 1 mL 9   ??? hydroxychloroquine (PLAQUENIL) 200 mg tablet Take 1 tablet (200 mg total) by mouth Two (2) times a day. 60 tablet 11   ??? leflunomide (ARAVA) 20 MG tablet Take 1 tablet (20 mg total) by mouth daily. 30 tablet 3   ??? lisinopril (PRINIVIL,ZESTRIL) 20 MG tablet Take 40 mg by mouth daily.      ??? montelukast (SINGULAIR) 10 mg tablet Take 1 tablet (10 mg total) by mouth nightly. 30 tablet 8   ??? omeprazole (PRILOSEC) 20 MG capsule Take 20 mg by mouth daily.     ??? predniSONE (DELTASONE) 5 MG tablet Take 2 tablets (10 mg total) by mouth daily. 60 tablet 3   ??? traZODone (DESYREL) 50 MG tablet Take 50 mg by mouth nightly.     ??? zoledronic acid-mannitol&water (RECLAST) 5 mg/100 mL PgBk Infuse 5 mg into a venous catheter once. Once yearly       No current facility-administered medications for this visit.         Changes to medications: Sandhya reports no changes at this time.    Allergies   Allergen Reactions   ??? Codeine Other (See Comments)     vomiting   ??? Morphine Other (See Comments)     vomiting       Changes to allergies: No    SPECIALTY MEDICATION ADHERENCE     Simponi 50mg /0.45ml: 0 days of medicine on hand     Medication Adherence    Patient reported X missed doses in the last month:  0  Specialty Medication:  Simponi 50mg /0.65ml  Support network for adherence:  family member          Specialty medication(s) dose(s) confirmed: Regimen is correct and unchanged.     Are there any concerns with adherence? No    Adherence counseling provided? Not needed    CLINICAL MANAGEMENT AND INTERVENTION      Clinical Benefit Assessment:    Do you feel the medicine is effective or helping your condition? Yes    Clinical Benefit counseling provided? Not needed    Adverse Effects Assessment:    Are you experiencing any side effects? No    Are you experiencing difficulty administering your medicine? No    Quality of Life Assessment:    How many days over the past month did your rheumatoid arthritis keep you from your normal activities? For example, brushing your teeth or getting up in the morning. Patient declined to answer    Have you discussed this with your provider? Not needed    Therapy Appropriateness:    Is therapy appropriate? Yes, therapy is appropriate and should be continued    DISEASE/MEDICATION-SPECIFIC INFORMATION      For patients on injectable medications: Patient currently has 0 doses left.  Next injection is scheduled for 08/29/2018.    PATIENT SPECIFIC NEEDS     ? Does the patient have any physical, cognitive, or cultural barriers? No    ? Is the patient high risk? No     ? Does the patient require a Care Management Plan? No     ? Does the patient require physician intervention or other additional services (i.e. nutrition, smoking cessation, social work)? No      SHIPPING     Specialty Medication(s) to be Shipped:   Inflammatory Disorders: Simponi 50mg /0.42ml    Other medication(s) to be shipped: none       Changes to insurance: No    Delivery Scheduled: Yes, Expected medication delivery date: 08/26/2018.     Medication will be delivered via Same Day Courier to the confirmed home address in [facility].    The patient will receive a drug information handout for each medication shipped and additional FDA Medication Guides as required.  Verified that patient has previously received a Conservation officer, historic buildings.    All of the patient's questions and concerns have been addressed.    Karene Fry Lavin Petteway   [facility] Shared [employer] [facility] Specialty Pharmacist

## 2018-08-19 DIAGNOSIS — I10 Essential (primary) hypertension: Principal | ICD-10-CM

## 2018-08-19 NOTE — Unmapped (Signed)
Patient did not show for scheduled visit in the [facility] at [facility].    Molli Barrows, MD

## 2018-08-25 NOTE — Unmapped (Signed)
Entered in error

## 2018-08-26 MED FILL — SIMPONI 50 MG/0.5 ML SUBCUTANEOUS PEN INJECTOR: 28 days supply | Qty: 0 | Fill #4 | Status: AC

## 2018-08-26 MED FILL — SIMPONI 50 MG/0.5 ML SUBCUTANEOUS PEN INJECTOR: 28 days supply | Qty: 0.5 | Fill #4

## 2018-09-23 NOTE — Unmapped (Signed)
[facility] Refill Coordination Note    Specialty Medication(s) to be Shipped:   Inflammatory Disorders: Simponi    Other medication(s) to be shipped: N/A     Sara Wagner, DOB: 28-Feb-1960  Phone: [phone] (home)       All above HIPAA information was verified with patient.     Completed refill call assessment today to schedule patient's medication shipment from the [facility] [phone]).       Specialty medication(s) and dose(s) confirmed: Regimen is correct and unchanged.   Changes to medications: Sara Wagner reports no changes at this time.  Changes to insurance: No  Questions for the pharmacist: No    Confirmed patient received Welcome Packet with first shipment. The patient will receive a drug information handout for each medication shipped and additional FDA Medication Guides as required.       DISEASE/MEDICATION-SPECIFIC INFORMATION        For patients on injectable medications: Patient currently has 0 doses left.  Next injection is scheduled for 8/13.    SPECIALTY MEDICATION ADHERENCE     Medication Adherence    Patient reported X missed doses in the last month: 0  Specialty Medication: Simponi  Support network for adherence: family member                [employer] address confirmed in [place].     Delivery Scheduled: Yes, Expected medication delivery date: 8/7.     Medication will be delivered via Same Day Courier to the prescription address in Epic [place].    Sara Wagner   [facility] Specialty Technician

## 2018-09-25 MED FILL — SIMPONI 50 MG/0.5 ML SUBCUTANEOUS PEN INJECTOR: 28 days supply | Qty: 0 | Fill #5 | Status: AC

## 2018-09-25 MED FILL — SIMPONI 50 MG/0.5 ML SUBCUTANEOUS PEN INJECTOR: 28 days supply | Qty: 0.5 | Fill #5

## 2018-10-19 NOTE — Unmapped (Signed)
[facility] RHEUMATOLOGY CLINIC - PHARMACIST NOTES    Received voicemail from patient in which she requested a call back with a Spanish interpreter regarding her medication prescribed by Dr. Olean Ree.    Returned patient's call with assistance from [employer]. She is requesting for prednisone 5mg  and leflunomide 20mg  refills to [facility].     Last visit with Carlus Pavlov Feb, 2020.   Appt with Dr. Olean Ree on 06/04/18 was canceled  Next appt with Dr. Olean Ree scheduled for 12/02/18.    Last labs done 04/16/18.   Dr. Olean Ree notified to send in scripts    Thuy

## 2018-10-20 DIAGNOSIS — M059 Rheumatoid arthritis with rheumatoid factor, unspecified: Secondary | ICD-10-CM

## 2018-10-20 DIAGNOSIS — M5432 Sciatica, left side: Secondary | ICD-10-CM

## 2018-10-20 MED ORDER — PREDNISONE 5 MG TABLET
ORAL_TABLET | Freq: Every day | ORAL | 1 refills | 30.00000 days | Status: CP
Start: 2018-10-20 — End: ?

## 2018-10-20 MED ORDER — LEFLUNOMIDE 20 MG TABLET
ORAL_TABLET | Freq: Every day | ORAL | 1 refills | 30.00000 days | Status: CP
Start: 2018-10-20 — End: 2019-10-20

## 2018-10-20 NOTE — Unmapped (Signed)
[facility] RHEUMATOLOGY CLINIC - PHARMACIST NOTES    Prednisone and leflunomide script sent on behalf of Dr. Olean Ree with 1 refill each to last until next clinic appt.     Chelsea Aus

## 2018-10-23 IMAGING — US US ART/VEN ABD/PELV/SCROTUM DOPPLER LTD
1 series · 13 of 25 positions shown · non-contrast
Comparison: CT 02/21/2016

CLINICAL DATA: Right adnexal mass

EXAM:
TRANSABDOMINAL AND TRANSVAGINAL ULTRASOUND OF PELVIS
DOPPLER ULTRASOUND OF OVARIES
TECHNIQUE: Both transabdominal and transvaginal ultrasound examinations of the
pelvis were performed. Transabdominal technique was performed for
global imaging of the pelvis including uterus, ovaries, adnexal
regions, and pelvic cul-de-sac.
It was necessary to proceed with endovaginal exam following the
transabdominal exam to visualize the endometrium and ovaries. Color
and duplex Doppler ultrasound was utilized to evaluate blood flow to
the ovaries.

[Series 1: us art/ven abd/pelv/scrotum doppler ltd · 0.18mm/px · 13 of 100 slices shown]
[im 1/100]
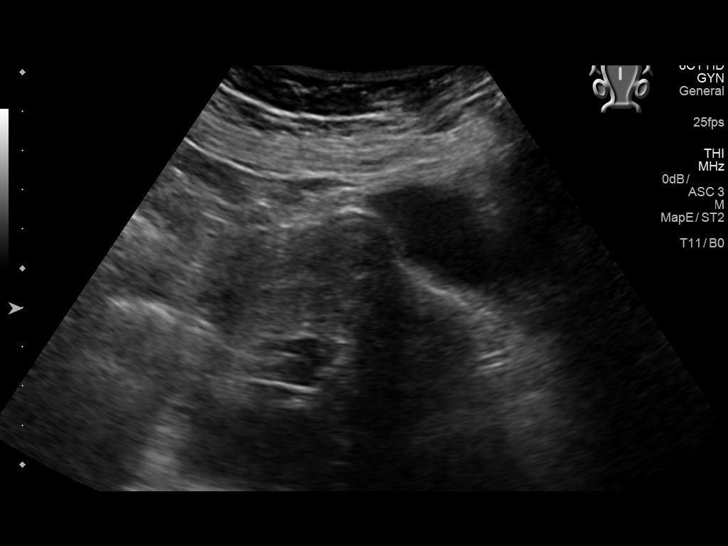
[im 9/100]
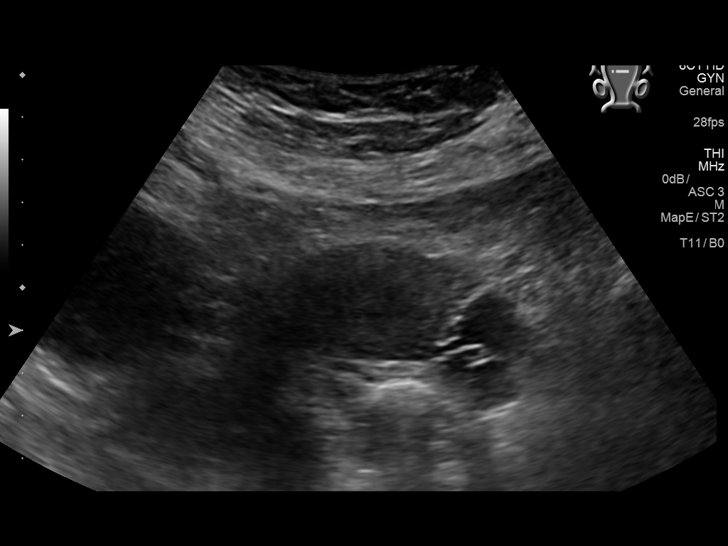
[im 17/100]
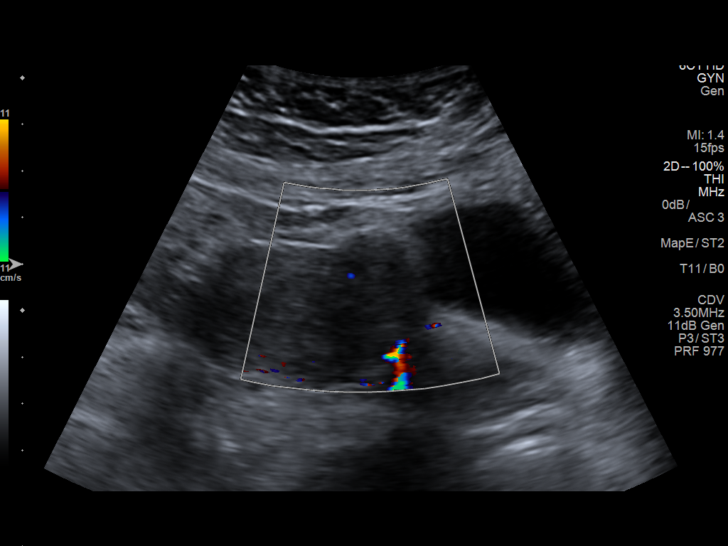
[im 25/100]
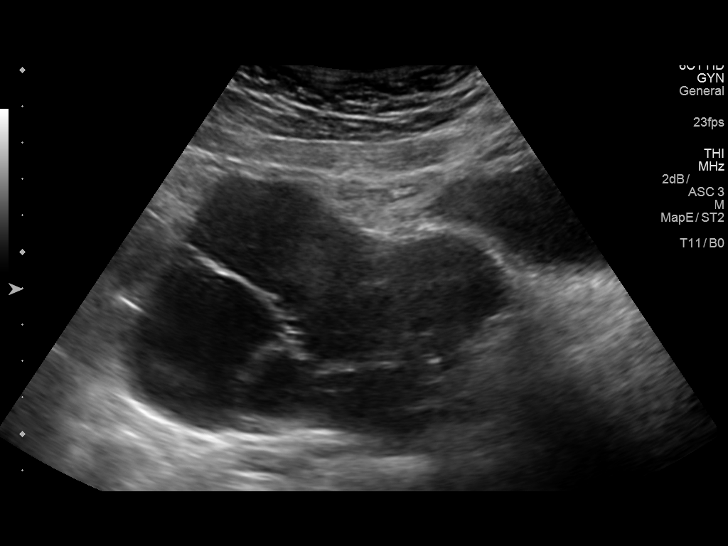
[im 34/100]
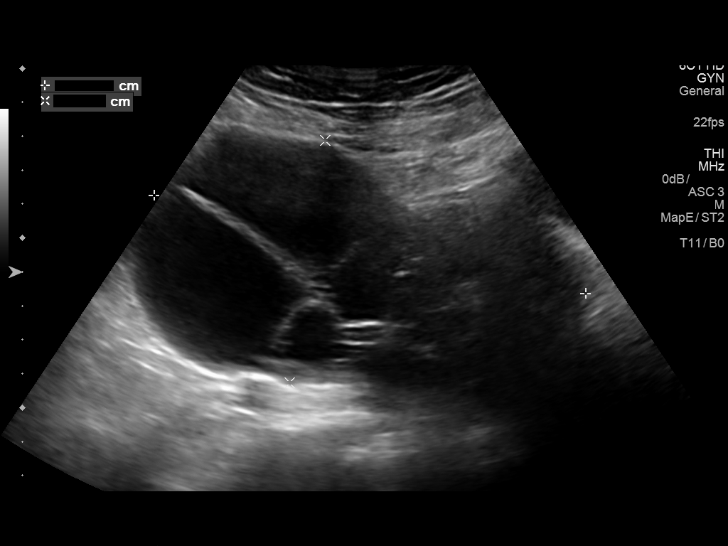
[im 42/100]
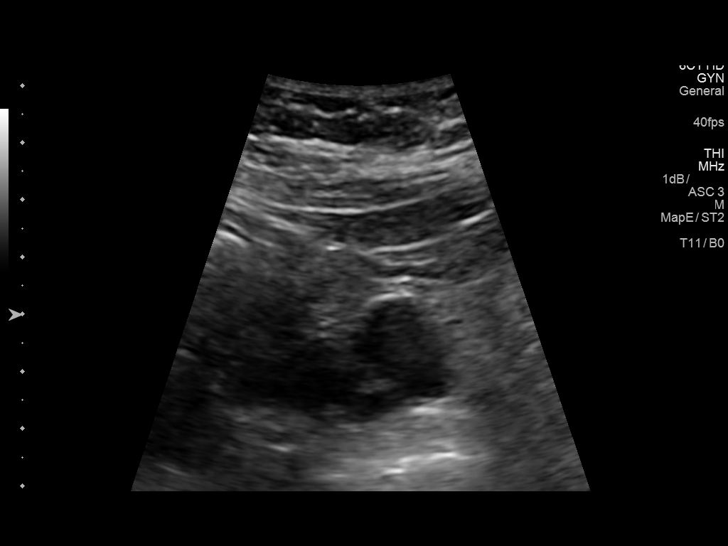
[im 50/100]
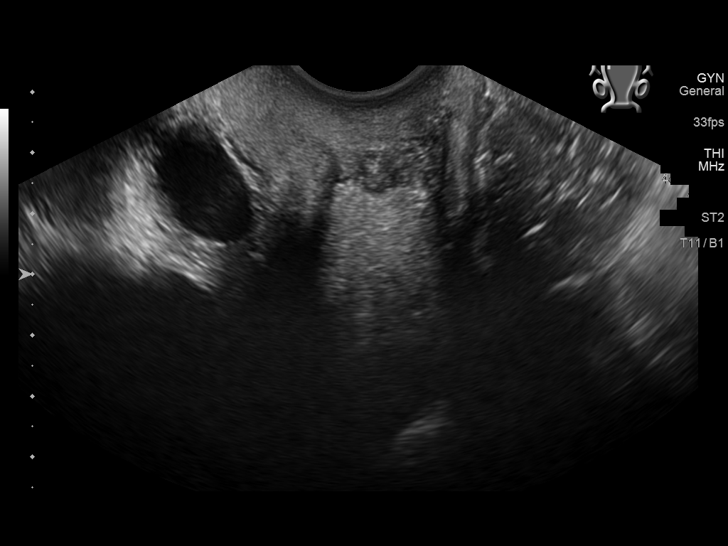
[im 58/100]
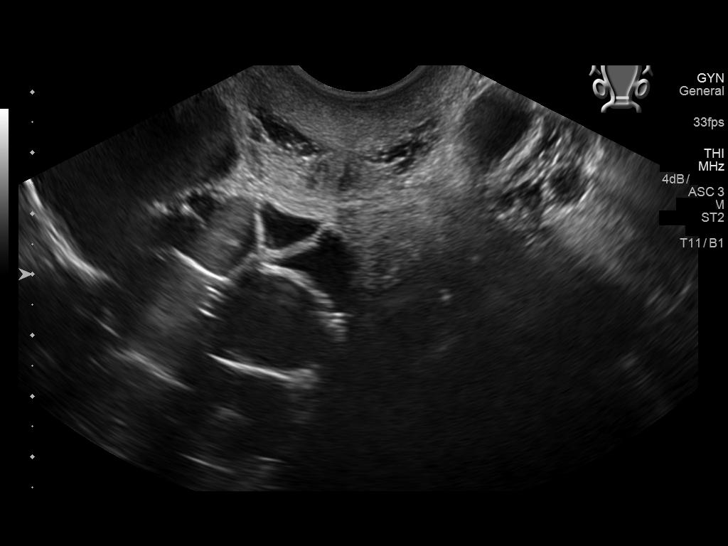
[im 67/100]
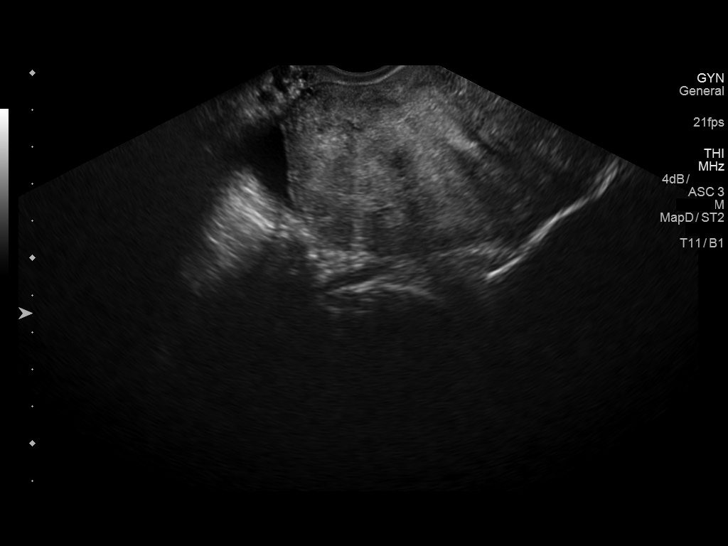
[im 75/100]
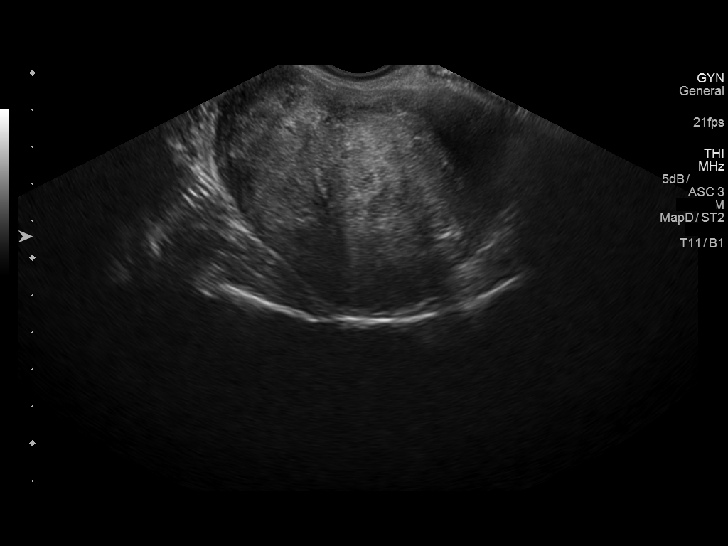
[im 83/100]
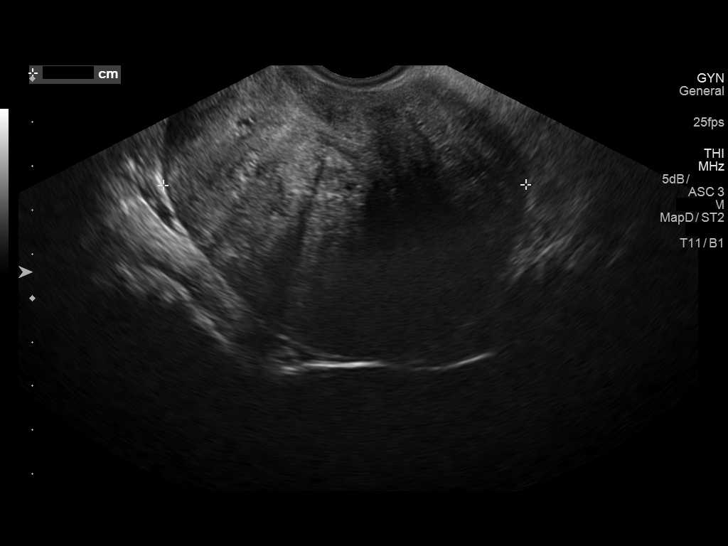
[im 91/100]
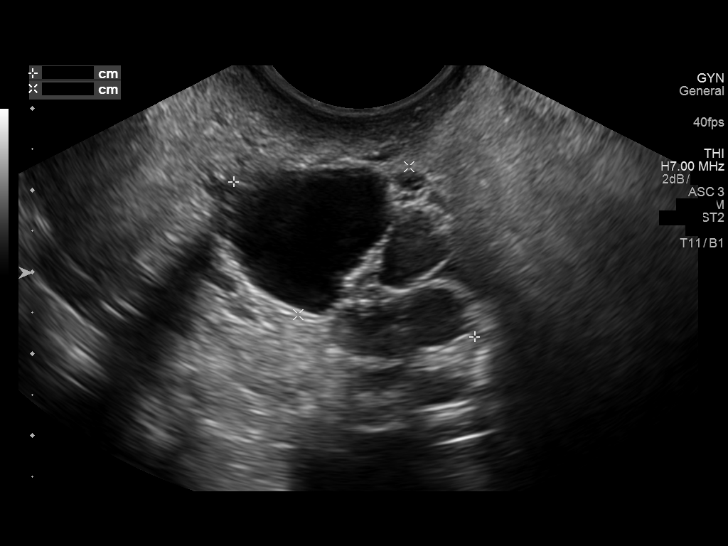
[im 100/100]
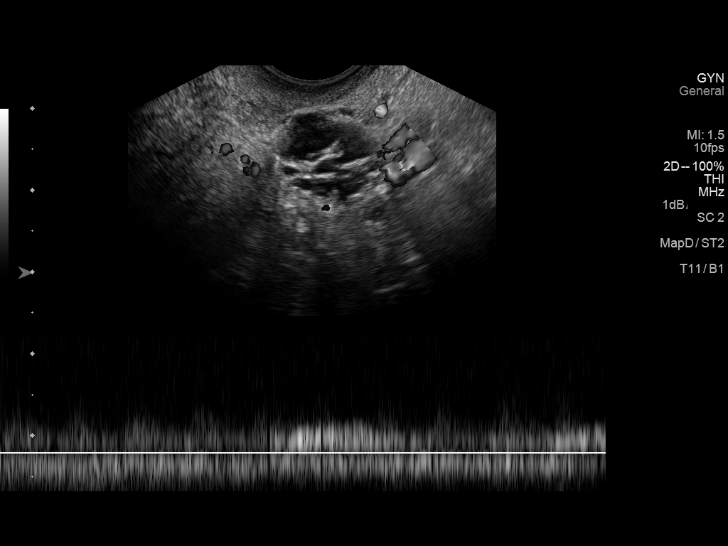

[13 of 25 positions shown; findings below may reference images not displayed]

FINDINGS: Uterus

Measurements: 7.3 x 3 x 3.5 cm.. Left anterior fibroid measuring
x 1.5 x 1.6 cm.

Endometrium

Thickness: 6 mm.  No focal abnormality visualized.

Right ovary

There is a large complex cystic and solid mass within the right
adnexal presumably arising from the right ovary. Overall dimensions
of the mass are 13 x 7.2 x 7.9 cm. Solid portion of the mass
measures 8.8 x 6.4 x 8.3 cm. Low is present within the solid portion
of the mass.

Left ovary

Complex cystic mass within the left adnexa measuring 3.5 x 2.3 x
cm. Flow present within the septa of the complex adnexal mass.

Pulsed Doppler evaluation of both ovaries demonstrates probable
normal arterial and venous waveform left ovary. Cannot confidently
assess right ovary for flow due to the large adnexal mass.

Other findings

No abnormal free fluid.
IMPRESSION: 1. Large complex cystic and solid right adnexal mass suspicious for
ovarian neoplasm. Surgical consultation is recommended.
2. Complex left ovarian/adnexal mass. Surgical consultation is
recommended.

## 2018-11-03 NOTE — Unmapped (Signed)
The [facility] has made a third and final attempt to reach this patient to refill the following medication:Simponi.      We have left voicemails on the following phone numbers: [phone] and have sent a MyChart message.    Dates contacted: 8/27, 9/2, 9/15  Last scheduled delivery: 8/6    The patient may be at risk of non-compliance with this medication. The patient should call the [facility] at [phone] (option 4) to refill medication.    Olga Millers   [facility] Specialty Technician

## 2018-11-09 NOTE — Unmapped (Signed)
[facility] Refill Coordination Note    Specialty Medication(s) to be Shipped:   Inflammatory Disorders: Simponi    Other medication(s) to be shipped: N/A     Sara Wagner, DOB: March 15, 1960  Phone: [phone] (home)       All above HIPAA information was verified with patient.     Completed refill call assessment today to schedule patient's medication shipment from the [facility] [phone]).       Specialty medication(s) and dose(s) confirmed: Regimen is correct and unchanged.   Changes to medications: Sara Wagner reports no changes at this time.  Changes to insurance: No  Questions for the pharmacist: No    Confirmed patient received Welcome Packet with first shipment. The patient will receive a drug information handout for each medication shipped and additional FDA Medication Guides as required.       DISEASE/MEDICATION-SPECIFIC INFORMATION        For patients on injectable medications: Patient currently has 0 doses left.  Next injection is scheduled for past due.    SPECIALTY MEDICATION ADHERENCE     Medication Adherence    Patient reported X missed doses in the last month: 1  Specialty Medication: SIMPONI 50 mg/0.5 mL Pnij pen injector (golimumab)  Patient is on additional specialty medications: No  Support network for adherence: family member            SHIPPING     Shipping address confirmed in Epic.     Delivery Scheduled: Yes, Expected medication delivery date: 11/11/2018.     Medication will be delivered via Same Day Courier to the home address in [employer] [place].    Sara Wagner   [facility] Specialty Technician

## 2018-11-11 MED FILL — SIMPONI 50 MG/0.5 ML SUBCUTANEOUS PEN INJECTOR: 28 days supply | Qty: 0.5 | Fill #6

## 2018-11-11 MED FILL — SIMPONI 50 MG/0.5 ML SUBCUTANEOUS PEN INJECTOR: 28 days supply | Qty: 0 | Fill #6 | Status: AC

## 2018-12-01 NOTE — Unmapped (Signed)
IMPRESSION:  1. RA, stable.   2. Osteoporosis, will receive second Reclast infusion in 12/2018.     PLAN:  ?? Continue leflunomide 20 mg daily and hydroxychloroquine 200 mg bid. Reduce prednisone to 7.5 daily x 4 weeks, then stay on 5 mg daily until the next visit.  ?? I reminded her to resume checking labs regularly, and we will obtain the following labs next week:  -     Albumin; Standing  -     ALT; Standing  -     AST; Standing  -     CBC; Standing  -     Creatinine; Standing  ?? She will obtain the flu vaccine at her local pharmacy.  ?? Continue vitamin D supplementation.  ? For her osteoporosis, she will need second Reclast infusion in 12/2018.  ? Return in about 4 months (around 04/04/2019).     I am located onsite and the patient is located offsite for this visit.     I spent 13 minutes on the phone with the patient. We were unable to connect via Epic [place] or Doximity due to technical difficulties. I spent an additional 10 minutes on pre- and post-visit activities.     The patient was physically located in [place] or a state in which I am permitted to provide care. The patient and/or parent/guardian understood that s/he may incur co-pays and cost sharing, and agreed to the telemedicine visit. The visit was reasonable and appropriate under the circumstances given the patient's presentation at the time.    The patient and/or parent/guardian has been advised of the potential risks and limitations of this mode of treatment (including, but not limited to, the absence of in-person examination) and has agreed to be treated using telemedicine. The patient's/patient's family's questions regarding telemedicine have been answered.     If the visit was completed in an ambulatory setting, the patient and/or parent/guardian has also been advised to contact their provider???s office for worsening conditions, and seek emergency medical treatment and/or call 911 if the patient deems either necessary. This note was entered by [place] Vo, acting as scribe for Annita Brod, MD.  Signature: CTV  Date: 12/02/18   Time: 2:01 PM    I have reviewed the documentation provided by the scribe and confirm that it accurately reflects the service I personally performed and the decisions made by me.  Signature: Naliah Eddington C. Benito Mccreedy  Associate Professor of Medicine  [employer], [place]  ___________________________________________________________________    Chief Complaint: hand, ankle, and hip pain    HPI:  Sara Wagner is a 58 y.o. female with hx of seropositive (-RF, ++CCP) erosive RA dx in 2000. She established care with Dr. Olean Ree in 2015. Previous therapies include methotrexate (unable to tolerate due to GI distress), Enbrel (stopped due to move and transfer of care), Humira (stopped due to side effects and reported inefficacy). Simponi recommended in 05/2015, but did not start until 06/2017. Continues leflunomide 20 mg daily, hydroxychloroquine 200 mg bid, Simponi SQ, and prednisone 10 mg. Additional hx of osteoporosis on dexa done 05/2017, Tscore Lspine -2.8, femur neck -1.3. Started reclast in 12/2017. Last clinic visit on 04/16/2018.    Today, patient presents reporting good days and bad days. During the bad days, she experiences pain and stiffness in the hands, ankles, and hips. Endorses one hour of morning stiffness. Reports ankle swelling when walking for long periods of time. She reports an episode of exacerbated asthma one  month ago. At the time, she was tested for COVID-19 twice, and both tests were negative. She was treated with antibiotics and has recovered well. She continues on Simponi, leflunomide, hydroxychloroquine, and prednisone. She had her first Reclast in 2019. Endorses intermittent dyspnea, which she attributes to her asthma and being out of Singulair. Admits to occasional diarrhea. Otherwise, denies fever, chills, fatigue, weight loss, cough, chest pain, nausea, vomiting, abdominal pain. Denies skin rashes.     LABS/IMAGING:    EXAM: XR HIP RIGHT W PELVIS 2 OR 3 VIEWS  DATE: 04/21/2018 4:26 PM  FINDINGS:   No acute fracture. Alignment is maintained. The joint spaces are preserved. Mild enthesopathy at bilateral greater trochanters. The sacroiliac joints and pubic symphysis are approximated.  IMPRESSION:  Mild enthesopathy at bilateral greater trochanters.     Appointment on 04/16/2018   Component Date Value   ??? Albumin 04/16/2018 4.4    ??? ALT 04/16/2018 27    ??? AST 04/16/2018 35    ??? WBC 04/16/2018 9.6    ??? RBC 04/16/2018 4.29    ??? HGB 04/16/2018 11.7*   ??? HCT 04/16/2018 36.5    ??? MCV 04/16/2018 85.1    ??? MCH 04/16/2018 27.3    ??? MCHC 04/16/2018 32.0    ??? RDW 04/16/2018 14.2    ??? MPV 04/16/2018 7.4    ??? Platelet 04/16/2018 396    ??? Creatinine 04/16/2018 0.63    ??? EGFR CKD-EPI Non-African* 04/16/2018 >90    ??? EGFR CKD-EPI African Ame* 04/16/2018 >90    ??? Vitamin D Total (25OH) 04/16/2018 27.6    ??? Calcium 04/16/2018 10.1        ROS: All systems were reviewed and negative except as noted in the HPI.    Past Medical History:   Diagnosis Date   ??? Asthma, extrinsic    ??? GERD (gastroesophageal reflux disease)    ??? HTN (hypertension)    ??? Rheumatoid arthritis(714.0)    ??? Situational depression      Immunization History   Administered Date(s) Administered   ??? Influenza Vaccine Quad (IIV4 PF) 66mo+ injectable 01/02/2017, 11/27/2017   ??? Influenza Virus Vaccine, unspecified formulation 04/07/2013   ??? PNEUMOCOCCAL POLYSACCHARIDE 23 01/02/2017   ??? Pneumococcal Conjugate 13-Valent 06/09/2015       Current Outpatient Medications:   ???  leflunomide (ARAVA) 20 MG tablet, Take 1 tablet (20 mg total) by mouth daily., Disp: 30 tablet, Rfl: 6 ???  predniSONE (DELTASONE) 5 MG tablet, Take 7.5 mg/d for 4 weeks then, stay on 5 mg/d., Disp: 60 tablet, Rfl: 4  ???  albuterol (PROVENTIL HFA;VENTOLIN HFA) 90 mcg/actuation inhaler, Inhale 2 puffs every four (4) hours as needed for wheezing., Disp: , Rfl:   ???  albuterol 2.5 mg /3 mL (0.083 %) nebulizer solution, Inhale 2.5 mg by nebulization every four (4) hours as needed for wheezing., Disp: , Rfl:   ???  amLODIPine (NORVASC) 10 MG tablet, Take 10 mg by mouth daily., Disp: , Rfl:   ???  calcium carbonate-vitamin D3 600 mg(1,500mg ) -400 unit Chew, Chew 1 capsule Two (2) times a day (at 8am and 12:00). (Patient not taking: Reported on 12/02/2018), Disp: 1 tablet, Rfl: 1  ???  fluticasone-salmeterol (ADVAIR) 500-50 mcg/dose diskus, Inhale 1 puff Two (2) times a day. (Patient not taking: Reported on 12/02/2018), Disp: 1 each, Rfl: 6  ???  golimumab (SIMPONI) 50 mg/0.5 mL PnIj pen injector, INJECT THE CONTENTS OF 1 AUTOINJECTOR (50MG ) UNDER THE SKIN EVERY 28 DAYS (  Patient not taking: Reported on 12/02/2018), Disp: 1 mL, Rfl: 9  ???  hydroxychloroquine (PLAQUENIL) 200 mg tablet, Take 1 tablet (200 mg total) by mouth Two (2) times a day. (Patient not taking: Reported on 12/02/2018), Disp: 60 tablet, Rfl: 11  ???  lisinopril (PRINIVIL,ZESTRIL) 20 MG tablet, Take 40 mg by mouth daily. , Disp: , Rfl:   ???  montelukast (SINGULAIR) 10 mg tablet, Take 1 tablet (10 mg total) by mouth nightly. (Patient not taking: Reported on 12/02/2018), Disp: 30 tablet, Rfl: 8  ???  omeprazole (PRILOSEC) 20 MG capsule, Take 20 mg by mouth daily., Disp: , Rfl:   ???  traZODone (DESYREL) 50 MG tablet, Take 50 mg by mouth nightly., Disp: , Rfl:   ???  zoledronic acid-mannitol&water (RECLAST) 5 mg/100 mL PgBk, Infuse 5 mg into a venous catheter once. Once yearly, Disp: , Rfl:     PHYSICAL EXAM: Deferred at this time.   There were no vitals taken for this visit.

## 2018-12-02 ENCOUNTER — Telehealth: Admit: 2018-12-02 | Discharge: 2018-12-03 | Attending: Rheumatology | Primary: Rheumatology

## 2018-12-02 MED ORDER — PREDNISONE 5 MG TABLET: tablet | 4 refills | 0 days | Status: AC

## 2018-12-02 MED ORDER — LEFLUNOMIDE 20 MG TABLET: 20 mg | tablet | Freq: Every day | 6 refills | 30 days | Status: AC

## 2018-12-14 NOTE — Unmapped (Signed)
The [facility] has made a third and final attempt to reach this patient to refill the following medication:Simponi.      We have left voicemails on the following phone numbers: [phone].    Dates contacted: 10/15, 10/22, 10/26  Last scheduled delivery: 9/23    The patient may be at risk of non-compliance with this medication. The patient should call the [facility] [place] [facility] at [phone] (option 4) to refill medication.    Olga Millers   [facility] Specialty Technician

## 2018-12-24 DIAGNOSIS — M059 Rheumatoid arthritis with rheumatoid factor, unspecified: Principal | ICD-10-CM

## 2018-12-24 MED ORDER — SIMPONI 50 MG/0.5 ML SUBCUTANEOUS PEN INJECTOR
SUBCUTANEOUS | 9 refills | 56 days | Status: CP
Start: 2018-12-24 — End: 2019-12-24
  Filled 2018-12-29: qty 1, 56d supply, fill #0

## 2018-12-24 NOTE — Unmapped (Signed)
[facility] Special[facility] Pharm[facility] Refill Coordination Note    Specialty Medication(s) to be Shipped:   Inflammatory Disorders: Simponi 50mg /0.80ml     Sara Wagner, DOB: 1960-11-30  Phone: [phone] (home)     All above HIPAA information was verified with patient's caregiver. Daughter, Sara Wagner    Completed refill call assessment today to schedule patient's medication shipment from the [facility] [phone]).       Specialty medication(s) and dose(s) confirmed: Regimen is correct and unchanged.   Changes to medications: Sara Wagner reports no changes at this time.  Changes to insurance: No  Questions for the pharmacist: No    Confirmed patient received Welcome Packet with first shipment. The patient will receive a drug information handout for each medication shipped and additional FDA Medication Guides as required.       DISEASE/MEDICATION-SPECIFIC INFORMATION        For patients on injectable medications: Patient currently has 0 doses left.  Next injection is scheduled for Daughter was not able to provide me the date.    SPECIALTY MEDICATION ADHERENCE     Medication Adherence    Patient reported X missed doses in the last month: 1  Specialty Medication: Simponi 50mg /0.63ml  Patient is on additional specialty medications: No  Informant: child/children  Reliability of informant: reliable  Support network for adherence: family member        SHIPPING     Shipping address confirmed in Epic.     Delivery Scheduled: Yes, Expected medication delivery date: 12/29/2018.     Medication will be delivered via Same Day Courier to the home address in Epic [place].    Sara Wagner   [facility] Specialty Technician

## 2018-12-29 MED FILL — SIMPONI 50 MG/0.5 ML SUBCUTANEOUS PEN INJECTOR: 56 days supply | Qty: 1 | Fill #0 | Status: AC

## 2019-01-18 NOTE — Unmapped (Signed)
Addended by: Loreli Slot on: 01/18/2019 09:42 AM     Modules accepted: Level of Service

## 2019-01-20 DIAGNOSIS — M5432 Sciatica, left side: Principal | ICD-10-CM

## 2019-01-20 DIAGNOSIS — M059 Rheumatoid arthritis with rheumatoid factor, unspecified: Principal | ICD-10-CM

## 2019-01-20 MED ORDER — HYDROXYCHLOROQUINE 200 MG TABLET
ORAL_TABLET | Freq: Two times a day (BID) | ORAL | 11 refills | 0 days | Status: CP
Start: 2019-01-20 — End: ?

## 2019-01-20 NOTE — Unmapped (Signed)
Patient needs refills on listed medications as soon as possible.     leflunomide (ARAVA) 20 MG tablet     hydroxychloroquine (PLAQUENIL) 200 mg tablet     predniSONE (DELTASONE) 5 MG tablet     [facility] [place], [place] - [address]

## 2019-01-20 NOTE — Unmapped (Signed)
Prednisone and leflunomide refilled on 12/02/2018.    HCQ refill  Last ov 12/02/2018

## 2019-01-20 NOTE — Unmapped (Signed)
[facility] Special[facility] Clinical Assessment & Refill Coordination Note    Sara Wagner, DOB: 01/30/1961  Phone: [phone] (home)     All above HIPAA information was verified with Sara Wagner daughter.     Specialty Medication(s):   Inflammatory Disorders: Simponi     Current Outpatient Medications   Medication Sig Dispense Refill   ??? albuterol (PROVENTIL HFA;VENTOLIN HFA) 90 mcg/actuation inhaler Inhale 2 puffs every four (4) hours as needed for wheezing.     ??? albuterol 2.5 mg /3 mL (0.083 %) nebulizer solution Inhale 2.5 mg by nebulization every four (4) hours as needed for wheezing.     ??? amLODIPine (NORVASC) 10 MG tablet Take 10 mg by mouth daily.     ??? calcium carbonate-vitamin D3 600 mg(1,500mg ) -400 unit Chew Chew 1 capsule Two (2) times a day (at 8am and 12:00). (Patient not taking: Reported on 12/02/2018) 1 tablet 1   ??? fluticasone-salmeterol (ADVAIR) 500-50 mcg/dose diskus Inhale 1 puff Two (2) times a day. (Patient not taking: Reported on 12/02/2018) 1 each 6   ??? golimumab (SIMPONI) 50 mg/0.5 mL PnIj pen injector Inject the contents of 1 pen (50 mg total) under the skin every twenty-eight (28) days. 1 mL 9   ??? hydroxychloroquine (PLAQUENIL) 200 mg tablet Take 1 tablet (200 mg total) by mouth Two (2) times a day. (Patient not taking: Reported on 12/02/2018) 60 tablet 11   ??? leflunomide (ARAVA) 20 MG tablet Take 1 tablet (20 mg total) by mouth daily. 30 tablet 6   ??? lisinopril (PRINIVIL,ZESTRIL) 20 MG tablet Take 40 mg by mouth daily.      ??? montelukast (SINGULAIR) 10 mg tablet Take 1 tablet (10 mg total) by mouth nightly. (Patient not taking: Reported on 12/02/2018) 30 tablet 8   ??? omeprazole (PRILOSEC) 20 MG capsule Take 20 mg by mouth daily.     ??? predniSONE (DELTASONE) 5 MG tablet Take 7.5 mg/d for 4 weeks then, stay on 5 mg/d. 60 tablet 4   ??? traZODone (DESYREL) 50 MG tablet Take 50 mg by mouth nightly. ??? zoledronic acid-mannitol&water (RECLAST) 5 mg/100 mL PgBk Infuse 5 mg into a venous catheter once. Once yearly       No current facility-administered medications for this visit.         Changes to medications: Sara Wagner reports no changes at this time.    Allergies   Allergen Reactions   ??? Codeine Other (See Comments)     vomiting   ??? Morphine Other (See Comments)     vomiting       Changes to allergies: No    SPECIALTY MEDICATION ADHERENCE     Simponi 50mg /0.65ml: 28 days of medicine on hand     Medication Adherence    Patient reported X missed doses in the last month: 0  Specialty Medication: Simponi 50mg /0.75ml  Support network for adherence: family member          Specialty medication(s) dose(s) confirmed: Regimen is correct and unchanged.     Are there any concerns with adherence? No    Adherence counseling provided? Not needed    CLINICAL MANAGEMENT AND INTERVENTION      Clinical Benefit Assessment:    Do you feel the medicine is effective or helping your condition? Yes    Clinical Benefit counseling provided? Not needed    Adverse Effects Assessment:    Are you experiencing any side effects? No    Are you experiencing difficulty administering your  medicine? No    Quality of Life Assessment:    How many days over the past month did your rheumatoid arthritis  keep you from your normal activities? For example, brushing your teeth or getting up in the morning. 0    Have you discussed this with your provider? Not needed    Therapy Appropriateness:    Is therapy appropriate? Yes, therapy is appropriate and should be continued    DISEASE/MEDICATION-SPECIFIC INFORMATION      For patients on injectable medications: Patient currently has 1 doses left.  Next injection is scheduled for ~02/10/2019.    PATIENT SPECIFIC NEEDS     ? Does the patient have any physical, cognitive, or cultural barriers? No    ? Is the patient high risk? No     ? Does the patient require a Care Management Plan? No ? Does the patient require physician intervention or other additional services (i.e. nutrition, smoking cessation, social work)? No      SHIPPING     Specialty Medication(s) to be Shipped: none    Other medication(s) to be shipped: none     Changes to insurance: No    Delivery Scheduled: Patient declined refill at this time due to having another month's worth of medication on hand at home..     The patient will receive a drug information handout for each medication shipped and additional FDA Medication Guides as required.  Verified that patient has previously received a Conservation officer, historic buildings.    All of the patient's questions and concerns have been addressed.    Sara Wagner Sara Wagner   Sara Wagner Specialty Pharmacist

## 2019-01-20 NOTE — Unmapped (Signed)
Addended by: Perlie Gold on: 01/20/2019 03:47 PM     Modules accepted: Orders

## 2019-03-02 NOTE — Unmapped (Signed)
[facility] Refill Coordination Note    Specialty Medication(s) to be Shipped:   Inflammatory Disorders: Simponi    Other medication(s) to be shipped: n/a     Sara Wagner, DOB: October 11, 1960  Phone: [phone] (home)       All above HIPAA information was verified with patient's family member, daughter.     Was a Nurse, learning disability used for this call? No    Completed refill call assessment today to schedule patient's medication shipment from the [facility] [phone]).       Specialty medication(s) and dose(s) confirmed: Regimen is correct and unchanged.   Changes to medications: Sara Wagner reports no changes at this time.  Changes to insurance: No  Questions for the pharmacist: No    Confirmed patient received Welcome Packet with first shipment. The patient will receive a drug information handout for each medication shipped and additional FDA Medication Guides as required.       DISEASE/MEDICATION-SPECIFIC INFORMATION        For patients on injectable medications: Patient currently has 0 doses left.  Next injection is scheduled for n/a.    SPECIALTY MEDICATION ADHERENCE     Medication Adherence    Patient reported X missed doses in the last month: 0  Specialty Medication: Simponi  Patient is on additional specialty medications: No  Patient is on more than two specialty medications: No  Any gaps in refill history greater than 2 weeks in the last 3 months: no  Demonstrates understanding of importance of adherence: yes  Informant: child/children  Support network for adherence: family member                Simponi 50mg /0.13ml: Patient has 0 days of medication on hand      SHIPPING     Shipping address confirmed in Epic.     Delivery Scheduled: Yes, Expected medication delivery date: 03/10/19.     Medication will be delivered via Same Day Courier to the prescription address in Epic [place].    Olga Millers   [facility] Specialty Technician

## 2019-03-10 MED FILL — SIMPONI 50 MG/0.5 ML SUBCUTANEOUS PEN INJECTOR: 56 days supply | Qty: 1 | Fill #1 | Status: AC

## 2019-03-10 MED FILL — SIMPONI 50 MG/0.5 ML SUBCUTANEOUS PEN INJECTOR: SUBCUTANEOUS | 56 days supply | Qty: 1 | Fill #1

## 2019-04-12 NOTE — Unmapped (Signed)
Opened in error

## 2019-05-10 NOTE — Unmapped (Signed)
The [facility] has made a third and final attempt to reach this patient to refill the following medication:Simponi.      We have left voicemails on the following phone numbers: [phone].    Dates contacted: 3/9, 3/16, 3/22  Last scheduled delivery: 1/20     The patient may be at risk of non-compliance with this medication. The patient should call the [facility] at [phone] (option 4) to refill medication.    Sara Wagner   [facility] Specialty Technician

## 2019-05-15 ENCOUNTER — Ambulatory Visit: Payer: Self-pay

## 2019-06-08 DIAGNOSIS — M05731 Rheumatoid arthritis with rheumatoid factor of right wrist without organ or systems involvement: Principal | ICD-10-CM

## 2019-06-08 DIAGNOSIS — M05732 Rheumatoid arthritis with rheumatoid factor of left wrist without organ or systems involvement: Principal | ICD-10-CM

## 2019-06-08 NOTE — Unmapped (Signed)
Addended by: Thornell Sartorius on: 06/08/2019 03:51 PM     Modules accepted: Orders

## 2019-06-08 NOTE — Unmapped (Signed)
Reason for call:     Pt is having transportation issues and needs to get lbs done before appt. She asked if the orders in system can be sent o [facility] so she can go to a local site to get drawn     Please call daughter at [phone] and let her know when labs are entered     Last ov: Visit date not found  Next ov: 06/16/2019

## 2019-06-08 NOTE — Unmapped (Addendum)
Labs entered into [facility] System per patient request. Pt daughter notified.

## 2019-06-11 NOTE — Unmapped (Signed)
IMPRESSION:  1. RA, not fully controlled.  2. Osteoporosis, missed second Reclast infusion.  3. Health Maintenance    PLAN:  ??? Discontinue Simponi due to lack of efficacy.  ??? Retrial of Enbrel PEN 50 mg weekly.   ??? Temporarily increase prednisone back to 7.5 mg daily.   ??? Continue hydroxychloroquine 200 mg bid. She is due for her annual eye exam.  ??? Continue leflunomide 20 mg daily.   ??? Increase supplementation with vitamin D3 1200 IU daily.   ??? Will schedule her for second Reclast infusion.   ??? Will update the following labs at [facility]:  -     Albumin; Future  -     ALT; Future  -     AST; Future  -     CBC; Future  -     Creatinine; Future  -     25 OH Vit D; Future  -     CRP  C-Reactive Protein; Future  ??? She is in the process of getting the Modern COVID-19 vaccination.  ??? Follow up 3 months with Carlus Pavlov [facility] and 7 months with myself.    I am located onsite and the patient is located offsite for this visit.     I spent 20 minutes on the real-time audio and video with the patient. I spent an additional 10 minutes on pre- and post-visit activities.     The patient was physically located in [place] or a state in which I am permitted to provide care. The patient and/or parent/guardian understood that s/he may incur co-pays and cost sharing, and agreed to the telemedicine visit. The visit was reasonable and appropriate under the circumstances given the patient's presentation at the time.    The patient and/or parent/guardian has been advised of the potential risks and limitations of this mode of treatment (including, but not limited to, the absence of in-person examination) and has agreed to be treated using telemedicine. The patient's/patient's family's questions regarding telemedicine have been answered.     If the visit was completed in an ambulatory setting, the patient and/or parent/guardian has also been advised to contact their provider???s office for worsening conditions, and seek emergency medical treatment and/or call 911 if the patient deems either necessary.     This note was entered by [place] Vo, acting as scribe for Annita Brod, MD.  Signature: CTV  Date: 06/16/19   Time: 11:20 AM    I have reviewed the documentation provided by the scribe and confirm that it accurately reflects the service I personally performed and the decisions made by me.  Signature: Johnrobert Foti C. Benito Mccreedy  Associate Professor of Medicine  [facility] of [place], [place]  ___________________________________________________________________    Chief Complaint: increased joint pain and swelling    HPI:  Sara Wagner is a 59 y.o. female with hx of seropositive (-RF, ++CCP) erosive RA dx in 2000. She established care with Dr. Olean Ree in 2015. Previous therapies include methotrexate (unable to tolerate due to GI distress), Enbrel (stopped due to move and transfer of care), Humira (stopped due to side effects and reported inefficacy). Simponi recommended in 05/2015, but did not start until 06/2017. Continues leflunomide 20 mg daily, hydroxychloroquine 200 mg bid, Simponi SQ, and prednisone 10 mg.??Additional hx of osteoporosis on Dexa done 05/2017, Tscore??Lspine -2.8, femur neck -1.3. Started Reclast in 12/2017.??    Last clinic visit on 12/02/2018.    Today, patient presents reporting two months of increased joint pain  and swelling, involving the hands, left wrist, and ankles. This started when she reduced prednisone from 7.5 to 5 mg daily. Endorses at least two hours of morning stiffness. She did not receive Simponi in April, but she thinks it only helped modestly with her joint pain and swelling. She states that when she lived in [place], she was on Enbrel and felt much better. Being on Enbrel also allowed her to discontinue prednisone. In regards to her osteoporosis, she was supposed to complete the second Reclast in 01/2019, but did not due to lack of transportation.    Endorses occasional fevers and chills. Endorses cough and sneezing, which she attributes to allergies. Endorses nausea last week that has resolved. Otherwise, denies fatigue, weight loss, chest pain, dyspnea, nausea, vomiting, diarrhea, abdominal pain. She completed the first dose of the Moderna COVID-19 vaccine.     LABS/IMAGING:    EXAM: XR HIP RIGHT W PELVIS 2 OR 3 VIEWS  DATE: 04/21/2018 4:26 PM  FINDINGS:   No acute fracture. Alignment is maintained. The joint spaces are preserved. Mild enthesopathy at bilateral greater trochanters. The sacroiliac joints and pubic symphysis are approximated.  IMPRESSION:  Mild enthesopathy at bilateral greater trochanters.    Telephone on 06/08/2019   Component Date Value   ??? AST 06/11/2019 26    ??? ALT 06/11/2019 20    ??? WBC 06/11/2019 6.0    ??? RBC 06/11/2019 4.50    ??? HGB 06/11/2019 11.8    ??? HCT 06/11/2019 36.8    ??? MCV 06/11/2019 82    ??? MCH 06/11/2019 26.2*   ??? MCHC 06/11/2019 32.1    ??? RDW 06/11/2019 14.5    ??? Platelet 06/11/2019 362    ??? Neutrophils % 06/11/2019 39    ??? Lymphocytes % 06/11/2019 32    ??? Monocytes % 06/11/2019 17    ??? Eosinophils % 06/11/2019 10    ??? Basophils % 06/11/2019 2    ??? Absolute Neutrophils 06/11/2019 2.3    ??? Absolute Lymphocytes 06/11/2019 1.9    ??? Absolute Monocytes  06/11/2019 1.0*   ??? Absolute Eosinophils 06/11/2019 0.6*   ??? Absolute Basophils  06/11/2019 0.1    ??? Immature Granulocytes 06/11/2019 0    ??? Bands Absolute 06/11/2019 0.0    ??? Creatinine 06/11/2019 0.61    ??? GFR MDRD Non Af Amer 06/11/2019 100    ??? GFR MDRD Af Amer 06/11/2019 116        ROS: All systems were reviewed and negative except as noted in the HPI.    Past Medical History:   Diagnosis Date   ??? Asthma, extrinsic    ??? GERD (gastroesophageal reflux disease)    ??? HTN (hypertension)    ??? Rheumatoid arthritis(714.0)    ??? Situational depression      Immunization History   Administered Date(s) Administered   ??? Influenza Vaccine Quad (IIV4 PF) 36mo+ injectable 01/02/2017, 11/27/2017   ??? Influenza Virus Vaccine, unspecified formulation 04/07/2013   ??? PNEUMOCOCCAL POLYSACCHARIDE 23 01/02/2017   ??? Pneumococcal Conjugate 13-Valent 06/09/2015       Current Outpatient Medications:   ???  albuterol (PROVENTIL HFA;VENTOLIN HFA) 90 mcg/actuation inhaler, Inhale 2 puffs every four (4) hours as needed for wheezing., Disp: , Rfl:   ???  albuterol 2.5 mg /3 mL (0.083 %) nebulizer solution, Inhale 2.5 mg by nebulization every four (4) hours as needed for wheezing., Disp: , Rfl:   ???  amLODIPine (NORVASC) 10 MG tablet, Take 10 mg by mouth  daily., Disp: , Rfl:   ???  calcium carbonate-vitamin D3 600 mg(1,500mg ) -400 unit Chew, Chew 1 capsule Two (2) times a day., Disp: 1 tablet, Rfl: 1  ???  ENBREL SURECLICK 50 MG/ML (0.98 ML) SUBCUTANEOUS PEN, Inject 1 mL (50 mg total) under the skin once a week., Disp: 4 mL, Rfl: 6  ???  fluticasone-salmeterol (ADVAIR) 500-50 mcg/dose diskus, Inhale 1 puff Two (2) times a day. (Patient not taking: Reported on 12/02/2018), Disp: 1 each, Rfl: 6  ???  hydrOXYchloroQUINE (PLAQUENIL) 200 mg tablet, Take 1 tablet (200 mg total) by mouth Two (2) times a day., Disp: 60 tablet, Rfl: 11  ???  leflunomide (ARAVA) 20 MG tablet, Take 1 tablet (20 mg total) by mouth daily., Disp: 30 tablet, Rfl: 6  ???  lisinopril (PRINIVIL,ZESTRIL) 20 MG tablet, Take 40 mg by mouth daily. , Disp: , Rfl:   ???  omeprazole (PRILOSEC) 20 MG capsule, Take 20 mg by mouth daily., Disp: , Rfl:   ???  predniSONE (DELTASONE) 5 MG tablet, Take 1.5 tablets (7.5 mg total) by mouth daily. Take 7.5 mg/d for 4 weeks then, stay on 5 mg/d., Disp: 60 tablet, Rfl: 4  ???  traZODone (DESYREL) 50 MG tablet, Take 50 mg by mouth nightly., Disp: , Rfl:   ???  zoledronic acid-mannitol&water (RECLAST) 5 mg/100 mL PgBk, Infuse 5 mg into a venous catheter once. Once yearly, Disp: , Rfl:     PHYSICAL EXAM:  There were no vitals taken for this visit.    General:   WN, WD, NAD   Skin  No inflammatory or vasculitic lesions or subcutaneous nodules. No malar rash, no discoid lesions, no purpura.    Eyes:   PERRL, conjunctiva and sclera not inflamed. Tears appear adequate.    ENT:   No oropharyngeal lesions. Mucous membranes moist. Oral mucosa without exudates or ulcers.   Lymph:   No masses. No parotid enlargement.    Cardiovascular:  Deferred at this time.    Gastrointestinal :  Deferred at this time.    Respiratory:  Deferred at this time.    Musculoskeletal:   Hands: Able to make a prayer sign. Able to make full fists. Swelling across MCPs and scattered PIPs.   Wrists: Full ROM. Swelling of the both wrists.   Elbows: Full ROM. No STS.  Shoulders: flexion normal, abduction normal, external rotation normal.  Knees/ankles/feet: Deferred at this time.    Neurological:  Alert. Oriented to time, place, and location.    Psych:  Appropriate affect and mood.

## 2019-06-12 LAB — CBC W/ DIFFERENTIAL
BASOPHILS ABSOLUTE COUNT: 0.1 10*3/uL (ref 0.0–0.2)
BASOPHILS RELATIVE PERCENT: 2 %
EOSINOPHILS ABSOLUTE COUNT: 0.6 10*3/uL — ABNORMAL HIGH (ref 0.0–0.4)
EOSINOPHILS RELATIVE PERCENT: 10 %
HEMATOCRIT: 36.8 % (ref 34.0–46.6)
HEMOGLOBIN: 11.8 g/dL (ref 11.1–15.9)
IMMATURE GRANULOCYTES: 0 %
LYMPHOCYTES ABSOLUTE COUNT: 1.9 10*3/uL (ref 0.7–3.1)
LYMPHOCYTES RELATIVE PERCENT: 32 %
MEAN CORPUSCULAR HEMOGLOBIN CONC: 32.1 g/dL (ref 31.5–35.7)
MEAN CORPUSCULAR HEMOGLOBIN: 26.2 pg — ABNORMAL LOW (ref 26.6–33.0)
MEAN CORPUSCULAR VOLUME: 82 fL (ref 79–97)
MONOCYTES ABSOLUTE COUNT: 1 10*3/uL — ABNORMAL HIGH (ref 0.1–0.9)
MONOCYTES RELATIVE PERCENT: 17 %
NEUTROPHILS ABSOLUTE COUNT: 2.3 10*3/uL (ref 1.4–7.0)
NEUTROPHILS RELATIVE PERCENT: 39 %
RED BLOOD CELL COUNT: 4.5 x10E6/uL (ref 3.77–5.28)
RED CELL DISTRIBUTION WIDTH: 14.5 % (ref 11.7–15.4)

## 2019-06-12 LAB — ALT (SGPT): Alanine aminotransferase:CCnc:Pt:Ser/Plas:Qn:: 20

## 2019-06-12 LAB — CREATININE
CREATININE: 0.61 mg/dL (ref 0.57–1.00)
Creatinine:MCnc:Pt:Ser/Plas:Qn:: 0.61

## 2019-06-12 LAB — AST (SGOT): Aspartate aminotransferase:CCnc:Pt:Ser/Plas:Qn:: 26

## 2019-06-12 LAB — MONOCYTES RELATIVE PERCENT: Monocytes/100 leukocytes:NFr:Pt:Bld:Qn:Automated count: 17

## 2019-06-14 NOTE — Unmapped (Signed)
[employer] Results have been recv'd & scanned into Pt's Chart for Review.   ??  -Thanks

## 2019-06-16 ENCOUNTER — Telehealth: Admit: 2019-06-16 | Discharge: 2019-06-17 | Attending: Rheumatology | Primary: Rheumatology

## 2019-06-16 DIAGNOSIS — M05731 Rheumatoid arthritis with rheumatoid factor of right wrist without organ or systems involvement: Principal | ICD-10-CM

## 2019-06-16 DIAGNOSIS — M059 Rheumatoid arthritis with rheumatoid factor, unspecified: Principal | ICD-10-CM

## 2019-06-16 DIAGNOSIS — M05732 Rheumatoid arthritis with rheumatoid factor of left wrist without organ or systems involvement: Secondary | ICD-10-CM

## 2019-06-16 DIAGNOSIS — M810 Age-related osteoporosis without current pathological fracture: Principal | ICD-10-CM

## 2019-06-16 DIAGNOSIS — M069 Rheumatoid arthritis, unspecified: Principal | ICD-10-CM

## 2019-06-16 MED ORDER — LEFLUNOMIDE 20 MG TABLET
ORAL_TABLET | Freq: Every day | ORAL | 6 refills | 30.00000 days | Status: CP
Start: 2019-06-16 — End: 2020-06-15

## 2019-06-16 MED ORDER — CALCIUM CARBONATE-VITAMIN D3 600 MG(1,500 MG)-400 UNIT CHEWABLE TABLET
ORAL_TABLET | Freq: Two times a day (BID) | ORAL | 11 refills | 0.00000 days | Status: CP
Start: 2019-06-16 — End: 2019-06-16

## 2019-06-16 MED ORDER — ENBREL SURECLICK 50 MG/ML (1 ML) SUBCUTANEOUS PEN INJECTOR
SUBCUTANEOUS | 6 refills | 28 days | Status: CP
Start: 2019-06-16 — End: ?

## 2019-06-16 MED ORDER — PREDNISONE 5 MG TABLET
ORAL_TABLET | Freq: Every day | ORAL | 4 refills | 40 days | Status: CP
Start: 2019-06-16 — End: ?

## 2019-06-16 MED ORDER — CALCIUM CARBONATE-VITAMIN D3 600 MG(1,500 MG)-400 UNIT CHEWABLE TABLET: 1 | tablet | Freq: Two times a day (BID) | 11 refills | 0 days | Status: AC

## 2019-07-05 ENCOUNTER — Ambulatory Visit: Admit: 2019-07-05 | Discharge: 2019-07-06

## 2019-07-05 LAB — CALCIUM: Calcium:MCnc:Pt:Ser/Plas:Qn:: 9.7

## 2019-07-05 NOTE — Unmapped (Signed)
Patient here for Reclast infusion.  Family member relates she has had this in the past.  No change in medications or allergies.  Explained process for today, oriented them to unit.  1502  IV #24 started in right antecubital space.  Good blood return, lab specimen obtained.  NS flushed, line secured.  Call bell within reach.  Lab specimen taken to lab.  1507  Premed:  Tylenol 650 mg po given.  Lab Results:   Creatinine from  06/11/19 - 0.61,  Calcium today 9.7  1540 Reclast 5mg /100 ml began infusing.  Family member relates she had a little difficulty with last one, rate slowed to 25 minutes.  She did not bring her inhaler with her today.

## 2019-07-05 NOTE — Unmapped (Signed)
1605 reclast infusion completed and well tolerated.  1635 Patient sent home in stable condition.

## 2019-08-05 NOTE — Unmapped (Signed)
[facility] RHEUMATOLOGY CLINIC - PHARMACIST NOTES    Referral for Enbrel mfg assistance has been closed as MAP team unable to reach patient to complete application.  Reached out to patient today and left a voicemail.     Chelsea Aus

## 2019-09-21 NOTE — Unmapped (Addendum)
This patient has been disenrolled from the [facility] specialty pharmacy services due to multiple unsuccessful outreach attempts by the pharmacy. Ms. Strieter has not returned our outreach calls to them since March 2021.    Karene Fry Christos Mixson  [facility] [employer] Specialty Pharmacist

## 2019-10-28 ENCOUNTER — Ambulatory Visit: Admit: 2019-10-28

## 2019-12-14 ENCOUNTER — Encounter: Payer: Self-pay | Admitting: Emergency Medicine

## 2019-12-14 ENCOUNTER — Emergency Department
Admission: EM | Admit: 2019-12-14 | Discharge: 2019-12-14 | Disposition: A | Discharge status: Home / Self Care | Payer: Self-pay | Attending: Emergency Medicine | Admitting: Emergency Medicine

## 2019-12-14 ENCOUNTER — Emergency Department: Payer: Self-pay

## 2019-12-14 ENCOUNTER — Other Ambulatory Visit: Payer: Self-pay

## 2019-12-14 DIAGNOSIS — R0789 Other chest pain: Secondary | ICD-10-CM | POA: Insufficient documentation

## 2019-12-14 DIAGNOSIS — J45909 Unspecified asthma, uncomplicated: Secondary | ICD-10-CM | POA: Insufficient documentation

## 2019-12-14 DIAGNOSIS — R42 Dizziness and giddiness: Secondary | ICD-10-CM | POA: Insufficient documentation

## 2019-12-14 DIAGNOSIS — R202 Paresthesia of skin: Secondary | ICD-10-CM | POA: Insufficient documentation

## 2019-12-14 DIAGNOSIS — Z7951 Long term (current) use of inhaled steroids: Secondary | ICD-10-CM | POA: Insufficient documentation

## 2019-12-14 DIAGNOSIS — I10 Essential (primary) hypertension: Secondary | ICD-10-CM | POA: Insufficient documentation

## 2019-12-14 DIAGNOSIS — Z79899 Other long term (current) drug therapy: Secondary | ICD-10-CM | POA: Insufficient documentation

## 2019-12-14 LAB — CBC
HCT: 38.6 % (ref 36.0–46.0)
Hemoglobin: 12.2 g/dL (ref 12.0–15.0)
MCH: 26.1 pg (ref 26.0–34.0)
MCHC: 31.6 g/dL (ref 30.0–36.0)
MCV: 82.7 fL (ref 80.0–100.0)
Platelets: 395 10*3/uL (ref 150–400)
RBC: 4.67 MIL/uL (ref 3.87–5.11)
RDW: 14.6 % (ref 11.5–15.5)
WBC: 11.4 10*3/uL — ABNORMAL HIGH (ref 4.0–10.5)
nRBC: 0 % (ref 0.0–0.2)

## 2019-12-14 LAB — BASIC METABOLIC PANEL
Anion gap: 7 (ref 5–15)
BUN: 19 mg/dL (ref 6–20)
CO2: 30 mmol/L (ref 22–32)
Calcium: 9.1 mg/dL (ref 8.9–10.3)
Chloride: 100 mmol/L (ref 98–111)
Creatinine, Ser: 0.52 mg/dL (ref 0.44–1.00)
GFR, Estimated: >60 mL/min (ref >60)
Glucose, Bld: 115 mg/dL — ABNORMAL HIGH (ref 70–99)
Potassium: 3.6 mmol/L (ref 3.5–5.1)
Sodium: 137 mmol/L (ref 135–145)

## 2019-12-14 LAB — HEPATIC FUNCTION PANEL
ALT: 23 U/L (ref 0–44)
AST: 28 U/L (ref 15–41)
Albumin: 4.3 g/dL (ref 3.5–5.0)
Alkaline Phosphatase: 74 U/L (ref 38–126)
Bilirubin, Direct: <0.1 mg/dL (ref 0.0–0.2)
Total Bilirubin: 0.7 mg/dL (ref 0.3–1.2)
Total Protein: 7.5 g/dL (ref 6.5–8.1)

## 2019-12-14 LAB — TROPONIN I (HIGH SENSITIVITY): Troponin I (High Sensitivity): 11 ng/L (ref <18)

## 2019-12-14 LAB — LIPASE, BLOOD: Lipase: 37 U/L (ref 11–51)

## 2019-12-14 MED ORDER — CLONIDINE HCL 0.1 MG PO TABS
0.1000 mg | ORAL_TABLET | Freq: Once | ORAL | Status: AC
  Administered 2019-12-14: 0.1 mg via ORAL
  Filled 2019-12-14: qty 1

## 2019-12-14 MED ORDER — CLONIDINE HCL 0.1 MG PO TABS: 0.1000 mg | ORAL_TABLET | Freq: Every day | ORAL | 0 refills | Status: AC | PRN

## 2019-12-14 MED ORDER — CLONIDINE HCL 0.1 MG TABLET
ORAL | 0 days
Start: 2019-12-14 — End: 2020-12-13

## 2019-12-14 NOTE — ED Triage Notes (Signed)
Pt spanish-speaking, interpreter used for triage. In w/HTN - BP 240's/100's PTA. States her bp has been increasing for 2 wks, and she went to the doc yesterday and started new bp med. 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 States she went to go sit down and the symptoms soon resolved. No neuro deficits on triage assessment, speech and vision clear. LSN 1030 during break at work this am. Denies any cp or L arm numbness at this time, does have hx of ministrokes 2 yrs ago.

## 2019-12-14 NOTE — ED Notes (Signed)
Pt denies current CP or left arm numbness. Pt able to easily move left arm, good grip strength. Pt A&O x4, denies further complaints. Interpreter used, but pt able to communicate without. Pt calm, stable, NAD at this time

## 2019-12-14 NOTE — ED Provider Notes (Signed)
[facility] Emergency Department Provider Note  Time seen: 4:11 PM  I have reviewed the triage vital signs and the nursing notes.   HISTORY  Chief Complaint Hypertension and Numbness   HPI Maria Griffin is a 59 y.o. female with a past medical history of hypertension, arthritis, presents to the emergency department for high blood pressure.  According to the patient  over the past 2 weeks she has had issues with her blood pressure, has seen her doctor and was started on new blood pressure medication yesterday.  Patient states today she began feeling dizzy with some chest tightness and left arm tingling.  States symptoms lasted approximate 20 minutes and resolved.  Patient states she has had similar symptoms in the past with high blood pressure and has been diagnosed with a mini stroke previously.  Patient denies any symptoms at this time, blood pressures currently 180/73 during my evaluation.  Patient denies headache at this time.  Patient states at home her blood pressure was 240 systolic before she came to the emergency department.  Past Medical History:  Diagnosis Date  . Arthritis   . Asthma   . Collagen vascular disease (HCC)   . Hypertension   . Osteoporosis   . Sciatica     Patient Active Problem List   Diagnosis Date Noted  . Depression 10/07/2014  . Arthritis 10/07/2014    Past Surgical History:  Procedure Laterality Date  . CESAREAN SECTION    . CHOLECYSTECTOMY    . UTERINE FIBROID SURGERY      Prior to Admission medications   Medication Sig Start Date End Date Taking? Authorizing Provider  albuterol (PROVENTIL HFA;VENTOLIN HFA) 108 (90 BASE) MCG/ACT inhaler Inhale 2 puffs into the lungs every 4 (four) hours as needed for wheezing or shortness of breath.    [provider]  albuterol (PROVENTIL) (2.5 MG/3ML) 0.083% nebulizer solution Take 2.5 mg by nebulization every 4 (four) hours as needed for wheezing or shortness of breath.     [provider]  Calcium Carbonate-Vitamin D (CALCIUM 600+D) 600-400 MG-UNIT per tablet Take 1 tablet by mouth 2 (two) times daily.    [provider]  cephALEXin (KEFLEX) 500 MG capsule Take 1 capsule (500 mg total) by mouth 3 (three) times daily. 02/21/16   Irean Hong, MD  Fluticasone-Salmeterol (ADVAIR) 500-50 MCG/DOSE AEPB Inhale 1 puff into the lungs 2 (two) times daily.    [provider]  Golimumab 50 MG/0.5ML SOAJ Inject 50 mg into the skin every 30 (thirty) days.    [provider]  hydroxychloroquine (PLAQUENIL) 200 MG tablet Take 200 mg by mouth 2 (two) times daily.    [provider]  leflunomide (ARAVA) 20 MG tablet Take 20 mg by mouth daily.    [provider]  montelukast (SINGULAIR) 10 MG tablet Take 10 mg by mouth at bedtime.    [provider]  ondansetron (ZOFRAN ODT) 4 MG disintegrating tablet Take 1 tablet (4 mg total) by mouth every 8 (eight) hours as needed for nausea or vomiting. 02/21/16   Irean Hong, MD  predniSONE (DELTASONE) 5 MG tablet Take 2 tablets (10 mg total) by mouth daily. 10/07/14   Jene Every, MD    Allergies  Allergen Reactions  . Codeine Nausea And Vomiting  . Morphine And Related Nausea And Vomiting    Family History  Problem Relation Age of Onset  . Breast cancer Sister 40  . Ovarian cancer Sister   .  Breast cancer Sister 69    Social History Social History   Tobacco Use  . Smoking status: Never Smoker  . Smokeless tobacco: Never Used  Substance Use Topics  . Alcohol use: No  . Drug use: Not on file    Review of Systems Constitutional: Negative for fever. Cardiovascular: Chest tightness Respiratory: Negative for shortness of breath. Gastrointestinal: Negative for abdominal pain Musculoskeletal: Negative for musculoskeletal complaints Neurological: Negative for headache, left arm tingling now resolved. All other ROS  negative  ____________________________________________   PHYSICAL EXAM:  VITAL SIGNS: ED Triage Vitals  Enc Vitals Group     BP 12/14/19 1343 (!) 159/66     Pulse Rate 12/14/19 1343 75     Resp 12/14/19 1343 18     Temp 12/14/19 1343 98.2 F (36.8 C)     Temp Source 12/14/19 1343 Oral     SpO2 12/14/19 1343 99 %     Weight 12/14/19 1344 154 lb 15.7 oz (70.3 kg)     Height --      Head Circumference --      Peak Flow --      Pain Score --      Pain Loc --      Pain Edu? --      Excl. in GC? --    Constitutional: Alert and oriented. Well appearing and in no distress. Eyes: Normal exam ENT      Head: Normocephalic and atraumatic.      Mouth/Throat: Mucous membranes are moist. Cardiovascular: Normal rate, regular rhythm. Respiratory: Normal respiratory effort without tachypnea nor retractions. Breath sounds are clear  Gastrointestinal: Soft and nontender. No distention.   Musculoskeletal: Nontender with normal range of motion in all extremities. Neurologic:  Normal speech and language. No gross focal neurologic deficits Skin:  Skin is warm, dry and intact.  Psychiatric: Mood and affect are normal.   ____________________________________________    EKG  EKG viewed and interpreted by myself shows a normal sinus rhythm at 77 bpm with a narrow QRS, normal axis, normal intervals, nonspecific ST changes.  ____________________________________________    RADIOLOGY  CT scan of the head is negative.   ____________________________________________   INITIAL IMPRESSION / ASSESSMENT AND PLAN / ED COURSE  Pertinent labs & imaging results that were available during my care of the patient were reviewed by me and considered in my medical decision making (see chart for details).   Patient presents emergency department for elevated blood pressure feeling dizzy chest tightness and tingling in her arm.  Patient states she took her blood pressure prior to arrival noticed 240  systolic.  Patient started a new blood pressure medication yesterday but does not recall the name.  Patient states her symptoms only lasted for approximately 15 or 20 minutes and then resolved.  Patient denies any symptoms currently.  Patient's work-up is overall reassuring including her lab work, CT scan head and physical exam.  We will dose 8.1 mg clonidine tablet and reassess.  Blood pressure currently 182/79.  Patient's blood pressure currently 129/73.  Patient states she is feeling much better.  Daughter is here as well.  I discussed with the patient to continue taking her blood pressure medications as prescribed by her doctor we will also write a as needed clonidine order for the patient if her blood pressure is greater than 170 systolic.  Patient agreeable to plan of care and will follow up with her doctor.  Sydell Axon was evaluated in Emergency Department on 12/14/2019  for the symptoms described in the history of present illness. She was evaluated in the context of the global COVID-19 pandemic, which necessitated consideration that the patient might be at risk for infection with the SARS-CoV-2 virus that causes COVID-19. Institutional protocols and algorithms that pertain to the evaluation of patients at risk for COVID-19 are in a state of rapid change based on information released by regulatory bodies including the CDC and federal and state organizations. These policies and algorithms were followed during the patient's care in the ED.  ____________________________________________   FINAL CLINICAL IMPRESSION(S) / ED DIAGNOSES  Hypertension   Minna Antis, MD 12/14/19 1701

## 2019-12-14 NOTE — ED Notes (Signed)
BIB [facility] from work pt c/o upper arm pain. No neuro deficits.   176/110 BP, HR in 80's. Needs interpreter.

## 2019-12-28 ENCOUNTER — Ambulatory Visit: Payer: Self-pay

## 2020-01-05 ENCOUNTER — Ambulatory Visit: Admit: 2020-01-05 | Discharge: 2020-01-05

## 2020-01-05 ENCOUNTER — Ambulatory Visit: Admit: 2020-01-05 | Discharge: 2020-01-05 | Attending: Rheumatology | Primary: Rheumatology

## 2020-01-05 DIAGNOSIS — K219 Gastro-esophageal reflux disease without esophagitis: Principal | ICD-10-CM

## 2020-01-05 DIAGNOSIS — M05732 Rheumatoid arthritis with rheumatoid factor of left wrist without organ or systems involvement: Principal | ICD-10-CM

## 2020-01-05 DIAGNOSIS — M05841 Other rheumatoid arthritis with rheumatoid factor of right hand: Principal | ICD-10-CM

## 2020-01-05 DIAGNOSIS — Z23 Encounter for immunization: Principal | ICD-10-CM

## 2020-01-05 DIAGNOSIS — J45909 Unspecified asthma, uncomplicated: Principal | ICD-10-CM

## 2020-01-05 DIAGNOSIS — M05741 Rheumatoid arthritis with rheumatoid factor of right hand without organ or systems involvement: Principal | ICD-10-CM

## 2020-01-05 DIAGNOSIS — M05842 Other rheumatoid arthritis with rheumatoid factor of left hand: Principal | ICD-10-CM

## 2020-01-05 DIAGNOSIS — Z7951 Long term (current) use of inhaled steroids: Principal | ICD-10-CM

## 2020-01-05 DIAGNOSIS — I10 Essential (primary) hypertension: Principal | ICD-10-CM

## 2020-01-05 DIAGNOSIS — M810 Age-related osteoporosis without current pathological fracture: Principal | ICD-10-CM

## 2020-01-05 DIAGNOSIS — M05742 Rheumatoid arthritis with rheumatoid factor of left hand without organ or systems involvement: Principal | ICD-10-CM

## 2020-01-05 DIAGNOSIS — M05731 Rheumatoid arthritis with rheumatoid factor of right wrist without organ or systems involvement: Principal | ICD-10-CM

## 2020-01-05 DIAGNOSIS — Z79899 Other long term (current) drug therapy: Principal | ICD-10-CM

## 2020-01-05 DIAGNOSIS — Z7952 Long term (current) use of systemic steroids: Principal | ICD-10-CM

## 2020-01-05 DIAGNOSIS — M059 Rheumatoid arthritis with rheumatoid factor, unspecified: Principal | ICD-10-CM

## 2020-01-05 MED ORDER — HYDROXYCHLOROQUINE 200 MG TABLET
ORAL_TABLET | Freq: Two times a day (BID) | ORAL | 11 refills | 30 days | Status: CP
Start: 2020-01-05 — End: ?

## 2020-01-05 MED ORDER — PREDNISONE 5 MG TABLET
ORAL_TABLET | Freq: Every day | ORAL | 5 refills | 40 days | Status: CP
Start: 2020-01-05 — End: ?

## 2020-01-05 MED ORDER — LEFLUNOMIDE 20 MG TABLET
ORAL_TABLET | Freq: Every day | ORAL | 6 refills | 30 days | Status: CP
Start: 2020-01-05 — End: 2021-01-04

## 2020-01-05 MED ORDER — XELJANZ 5 MG TABLET
ORAL_TABLET | Freq: Two times a day (BID) | ORAL | 5 refills | 30.00000 days | Status: CP
Start: 2020-01-05 — End: ?

## 2020-01-10 DIAGNOSIS — R7989 Other specified abnormal findings of blood chemistry: Principal | ICD-10-CM

## 2020-01-10 MED ORDER — ERGOCALCIFEROL (VITAMIN D2) 1,250 MCG (50,000 UNIT) CAPSULE
ORAL_CAPSULE | ORAL | 0 refills | 56 days | Status: CP
Start: 2020-01-10 — End: 2021-01-09

## 2020-01-26 ENCOUNTER — Ambulatory Visit: Payer: Self-pay | Attending: Oncology

## 2020-01-26 ENCOUNTER — Other Ambulatory Visit: Payer: Self-pay

## 2020-01-26 ENCOUNTER — Encounter (INDEPENDENT_AMBULATORY_CARE_PROVIDER_SITE_OTHER): Payer: Self-pay

## 2020-01-26 ENCOUNTER — Ambulatory Visit
Admission: RE | Admit: 2020-01-26 | Discharge: 2020-01-26 | Disposition: A | Discharge status: Home / Self Care | Payer: Self-pay | Source: Ambulatory Visit | Attending: Oncology | Admitting: Oncology

## 2020-01-26 VITALS — BP 125/58 | HR 73 | Temp 97.4°F | Ht 62.0 in | Wt 152.9 lb

## 2020-01-26 DIAGNOSIS — Z0000 Encounter for general adult medical examination without abnormal findings: Secondary | ICD-10-CM

## 2020-01-26 DIAGNOSIS — Z9189 Other specified personal risk factors, not elsewhere classified: Secondary | ICD-10-CM

## 2020-01-26 NOTE — Progress Notes (Signed)
  Subjective:     Patient ID: Sydell Axon, female   DOB: 27-Apr-1960, 59 y.o.   MRN: [id number]  HPI   Review of Systems     Objective:   Physical Exam Chest:     Breasts:        Right: No swelling, bleeding, inverted nipple, mass, nipple discharge, skin change or tenderness.        Left: No swelling, bleeding, inverted nipple, mass, nipple discharge, skin change or tenderness.  Lymphadenopathy:     Upper Body:     Right upper body: No supraclavicular or axillary adenopathy.     Left upper body: No supraclavicular or axillary adenopathy.        Assessment:     59 year old patient presents for [facility] clinic visit.  Patient screened, and meets BCCCP eligibility.  Patient does not have insurance, Medicare or Medicaid.   Instructed patient on breast self awareness using teach back method.  Clinical breast exam unremarkable.   Risk Assessment    Risk Scores      01/26/2020   Last edited by: Jim Like, RN   5-year risk: 4.4 %   Lifetime risk: 22.8 %             Plan:     Sent for bilateral screening mammogram.  Patient requests referral to High Risk Clinic related to family history, and risk assessment score

## 2020-02-01 NOTE — Progress Notes (Signed)
Letter mailed from [facility] to notify of normal mammogram results.  Patient to return in one year for annual screening.  Copy to [facility]. 

## 2020-03-14 MED ORDER — PEG 3350-ELECTROLYTES 236 GRAM-22.74 GRAM-6.74 GRAM-5.86 GRAM SOLUTION
Freq: Once | ORAL | 0 refills | 1 days | Status: CP
Start: 2020-03-14 — End: 2020-03-18

## 2020-03-17 MED FILL — PEG 3350-ELECTROLYTES 236 GRAM-22.74 GRAM-6.74 GRAM-5.86 GRAM SOLUTION: ORAL | 1 days supply | Qty: 4000 | Fill #0

## 2020-03-27 ENCOUNTER — Ambulatory Visit: Admit: 2020-03-27 | Discharge: 2020-03-28 | Attending: Nephrology | Primary: Nephrology

## 2020-03-27 DIAGNOSIS — I10 Essential (primary) hypertension: Principal | ICD-10-CM

## 2020-03-27 MED ORDER — CHLORTHALIDONE 25 MG TABLET
ORAL_TABLET | Freq: Every morning | ORAL | 11 refills | 30.00000 days | Status: CP
Start: 2020-03-27 — End: 2021-03-27

## 2020-03-27 MED ORDER — NIFEDIPINE ER 90 MG TABLET,EXTENDED RELEASE 24 HR
ORAL_TABLET | Freq: Every day | ORAL | 3 refills | 90 days | Status: CP
Start: 2020-03-27 — End: 2021-03-27

## 2020-03-27 MED ORDER — CARVEDILOL 12.5 MG TABLET
ORAL_TABLET | Freq: Two times a day (BID) | ORAL | 3 refills | 90 days | Status: CP
Start: 2020-03-27 — End: 2021-03-27

## 2020-04-14 ENCOUNTER — Ambulatory Visit: Admit: 2020-04-14 | Discharge: 2020-04-15

## 2020-04-14 DIAGNOSIS — I10 Essential (primary) hypertension: Principal | ICD-10-CM

## 2020-05-05 ENCOUNTER — Encounter: Admit: 2020-05-05 | Discharge: 2020-05-05 | Attending: Anesthesiology | Primary: Anesthesiology

## 2020-05-05 ENCOUNTER — Ambulatory Visit: Admit: 2020-05-05 | Discharge: 2020-05-05

## 2020-06-12 ENCOUNTER — Ambulatory Visit: Admit: 2020-06-12 | Discharge: 2020-06-13 | Attending: Nephrology | Primary: Nephrology

## 2020-06-12 MED ORDER — NIFEDIPINE ER 90 MG TABLET,EXTENDED RELEASE 24 HR
ORAL_TABLET | Freq: Every day | ORAL | 3 refills | 90 days | Status: CP
Start: 2020-06-12 — End: 2021-06-12

## 2020-08-28 ENCOUNTER — Ambulatory Visit: Admit: 2020-08-28 | Discharge: 2020-08-29

## 2020-08-28 DIAGNOSIS — I10 Essential (primary) hypertension: Principal | ICD-10-CM

## 2020-09-04 DIAGNOSIS — M810 Age-related osteoporosis without current pathological fracture: Principal | ICD-10-CM

## 2020-09-04 DIAGNOSIS — M05732 Rheumatoid arthritis with rheumatoid factor of left wrist without organ or systems involvement: Principal | ICD-10-CM

## 2020-09-04 DIAGNOSIS — M05742 Rheumatoid arthritis with rheumatoid factor of left hand without organ or systems involvement: Principal | ICD-10-CM

## 2020-09-04 DIAGNOSIS — M05731 Rheumatoid arthritis with rheumatoid factor of right wrist without organ or systems involvement: Principal | ICD-10-CM

## 2020-09-04 DIAGNOSIS — M05741 Rheumatoid arthritis with rheumatoid factor of right hand without organ or systems involvement: Principal | ICD-10-CM

## 2020-09-04 MED ORDER — LEFLUNOMIDE 20 MG TABLET
ORAL_TABLET | 10 refills | 0.00000 days
Start: 2020-09-04 — End: ?

## 2020-09-05 MED ORDER — LEFLUNOMIDE 20 MG TABLET
ORAL_TABLET | 10 refills | 0 days | Status: CP
Start: 2020-09-05 — End: ?

## 2020-09-12 DIAGNOSIS — M5440 Lumbago with sciatica, unspecified side: Principal | ICD-10-CM

## 2020-09-14 ENCOUNTER — Ambulatory Visit: Admit: 2020-09-14 | Discharge: 2020-09-15 | Attending: Rheumatology | Primary: Rheumatology

## 2020-09-14 DIAGNOSIS — M05742 Rheumatoid arthritis with rheumatoid factor of left hand without organ or systems involvement: Principal | ICD-10-CM

## 2020-09-14 DIAGNOSIS — M05732 Rheumatoid arthritis with rheumatoid factor of left wrist without organ or systems involvement: Principal | ICD-10-CM

## 2020-09-14 DIAGNOSIS — M05741 Rheumatoid arthritis with rheumatoid factor of right hand without organ or systems involvement: Principal | ICD-10-CM

## 2020-09-14 DIAGNOSIS — M810 Age-related osteoporosis without current pathological fracture: Principal | ICD-10-CM

## 2020-09-14 DIAGNOSIS — M05731 Rheumatoid arthritis with rheumatoid factor of right wrist without organ or systems involvement: Principal | ICD-10-CM

## 2020-09-14 DIAGNOSIS — M059 Rheumatoid arthritis with rheumatoid factor, unspecified: Principal | ICD-10-CM

## 2020-09-14 MED ORDER — PREDNISONE 5 MG TABLET
ORAL_TABLET | Freq: Every day | ORAL | 5 refills | 40.00000 days | Status: CP
Start: 2020-09-14 — End: ?

## 2020-09-14 MED ORDER — HYDROXYCHLOROQUINE 200 MG TABLET
ORAL_TABLET | Freq: Two times a day (BID) | ORAL | 11 refills | 30.00000 days | Status: CP
Start: 2020-09-14 — End: ?

## 2020-09-14 MED ORDER — ACTEMRA ACTPEN 162 MG/0.9 ML SUBCUTANEOUS PEN INJECTOR
SUBCUTANEOUS | 6 refills | 0 days | Status: CP
Start: 2020-09-14 — End: ?

## 2020-09-14 MED ORDER — LEFLUNOMIDE 20 MG TABLET
ORAL_TABLET | Freq: Every day | ORAL | 10 refills | 30 days | Status: CP
Start: 2020-09-14 — End: ?

## 2020-10-30 MED ORDER — XELJANZ 5 MG TABLET
ORAL_TABLET | 10 refills | 0 days
Start: 2020-10-30 — End: ?

## 2020-10-31 MED ORDER — XELJANZ 5 MG TABLET
ORAL_TABLET | 10 refills | 0 days
Start: 2020-10-31 — End: ?

## 2020-11-11 ENCOUNTER — Ambulatory Visit: Admit: 2020-11-11 | Discharge: 2020-11-12

## 2020-11-14 DIAGNOSIS — M5440 Lumbago with sciatica, unspecified side: Principal | ICD-10-CM

## 2021-01-17 ENCOUNTER — Telehealth: Admit: 2021-01-17

## 2021-03-05 ENCOUNTER — Other Ambulatory Visit: Payer: Self-pay

## 2021-03-05 ENCOUNTER — Emergency Department
Admission: EM | Admit: 2021-03-05 | Discharge: 2021-03-05 | Disposition: A | Discharge status: Home / Self Care | Payer: Self-pay | Attending: Emergency Medicine | Admitting: Emergency Medicine

## 2021-03-05 ENCOUNTER — Emergency Department: Payer: Self-pay

## 2021-03-05 DIAGNOSIS — R109 Unspecified abdominal pain: Secondary | ICD-10-CM

## 2021-03-05 DIAGNOSIS — I10 Essential (primary) hypertension: Secondary | ICD-10-CM | POA: Insufficient documentation

## 2021-03-05 DIAGNOSIS — K529 Noninfective gastroenteritis and colitis, unspecified: Secondary | ICD-10-CM | POA: Insufficient documentation

## 2021-03-05 DIAGNOSIS — J45909 Unspecified asthma, uncomplicated: Secondary | ICD-10-CM | POA: Insufficient documentation

## 2021-03-05 LAB — COMPREHENSIVE METABOLIC PANEL
ALT: 29 U/L (ref 0–44)
AST: 37 U/L (ref 15–41)
Albumin: 3.8 g/dL (ref 3.5–5.0)
Alkaline Phosphatase: 79 U/L (ref 38–126)
Anion gap: 8 (ref 5–15)
BUN: 10 mg/dL (ref 6–20)
CO2: 27 mmol/L (ref 22–32)
Calcium: 9.4 mg/dL (ref 8.9–10.3)
Chloride: 101 mmol/L (ref 98–111)
Creatinine, Ser: 0.7 mg/dL (ref 0.44–1.00)
GFR, Estimated: >60 mL/min (ref >60)
Glucose, Bld: 113 mg/dL — ABNORMAL HIGH (ref 70–99)
Potassium: 3.5 mmol/L (ref 3.5–5.1)
Sodium: 136 mmol/L (ref 135–145)
Total Bilirubin: 0.7 mg/dL (ref 0.3–1.2)
Total Protein: 7.2 g/dL (ref 6.5–8.1)

## 2021-03-05 LAB — CBC
HCT: 33.6 % — ABNORMAL LOW (ref 36.0–46.0)
Hemoglobin: 10.8 g/dL — ABNORMAL LOW (ref 12.0–15.0)
MCH: 27.4 pg (ref 26.0–34.0)
MCHC: 32.1 g/dL (ref 30.0–36.0)
MCV: 85.3 fL (ref 80.0–100.0)
Platelets: 439 10*3/uL — ABNORMAL HIGH (ref 150–400)
RBC: 3.94 MIL/uL (ref 3.87–5.11)
RDW: 14.5 % (ref 11.5–15.5)
WBC: 9.5 10*3/uL (ref 4.0–10.5)
nRBC: 0 % (ref 0.0–0.2)

## 2021-03-05 LAB — LIPASE, BLOOD: Lipase: 26 U/L (ref 11–51)

## 2021-03-05 MED ORDER — FAMOTIDINE 20 MG PO TABS
20.0000 mg | ORAL_TABLET | Freq: Once | ORAL | Status: AC
Start: 2021-03-05 — End: 2021-03-05
  Administered 2021-03-05: 20 mg via ORAL
  Filled 2021-03-05: qty 1

## 2021-03-05 MED ORDER — IOHEXOL 300 MG/ML  SOLN
100.0000 mL | Freq: Once | INTRAMUSCULAR | Status: AC | PRN
  Administered 2021-03-05: 100 mL via INTRAVENOUS

## 2021-03-05 MED ORDER — FAMOTIDINE 20 MG PO TABS: 20.0000 mg | ORAL_TABLET | Freq: Two times a day (BID) | ORAL | 0 refills | Status: AC

## 2021-03-05 MED ORDER — ONDANSETRON 4 MG PO TBDP
4.0000 mg | ORAL_TABLET | Freq: Once | ORAL | Status: AC
  Administered 2021-03-05: 4 mg via ORAL
  Filled 2021-03-05: qty 1

## 2021-03-05 MED ORDER — ALUM & MAG HYDROXIDE-SIMETH 200-200-20 MG/5ML PO SUSP
30.0000 mL | Freq: Once | ORAL | Status: AC
  Administered 2021-03-05: 30 mL via ORAL
  Filled 2021-03-05: qty 30

## 2021-03-05 MED ORDER — ONDANSETRON HCL 4 MG PO TABS: 4.0000 mg | ORAL_TABLET | Freq: Every day | ORAL | 1 refills | Status: AC | PRN

## 2021-03-05 NOTE — ED Provider Notes (Signed)
[facility] Provider Note    Event Date/Time   First MD Initiated Contact with Patient 03/05/21 1615     (approximate)   History   Abdominal Pain   HPI  Maria Griffin is a 61 y.o. female with past medical history of rheumatoid arthritis, hypertension, asthma who presents with abdominal pain nausea vomiting and diarrhea.  Symptoms started about 3 days ago.  She endorses pain that is located both in the epigastrium and bilateral lower quadrants more diffusely.  Pain comes and goes.  Made worse by eating.  Has had several episodes of nonbloody emesis as well as loose stool that is nonbloody.  She has had some congestion, no fevers.  No sick contacts.  Denies urinary symptoms.  Patient is on chronic prednisone for her rheumatoid arthritis.    Past Medical History:  Diagnosis Date   Arthritis    Asthma    Collagen vascular disease ([place])    Hypertension    Osteoporosis    Sciatica     Patient Active Problem List   Diagnosis Date Noted   Depression 10/07/2014   Arthritis 10/07/2014     Physical Exam  Triage Vital Signs: ED Triage Vitals [03/05/21 1244]  Enc Vitals Group     BP 126/85     Pulse Rate 78     Resp 18     Temp 98.9 F (37.2 C)     Temp src      SpO2 100 %     Weight      Height      Head Circumference      Peak Flow      Pain Score      Pain Loc      Pain Edu?      Excl. in [place]?     Most recent vital signs: Vitals:   03/05/21 1640 03/05/21 1730  BP: 117/76 120/64  Pulse: 85 88  Resp: 18 18  Temp:    SpO2: 96% 97%     General: Awake, no distress.  Mucous membranes are moist CV:  Good peripheral perfusion.  Resp:  Normal effort.  Abd:  No distention.  Abdomen is soft, mild tenderness to palpation in the left lower quadrant, no guarding Neuro:             Awake, Alert, Oriented x 3  Other:     ED Results / Procedures / Treatments  Labs (all labs ordered are listed, but only abnormal results are  displayed) Labs Reviewed  COMPREHENSIVE METABOLIC PANEL - Abnormal; Notable for the following components:      Result Value   Glucose, Bld 113 (*)    All other components within normal limits  CBC - Abnormal; Notable for the following components:   Hemoglobin 10.8 (*)    HCT 33.6 (*)    Platelets 439 (*)    All other components within normal limits  LIPASE, BLOOD     EKG     RADIOLOGY Reviewed the CT abdomen pelvis which shows some inflammation of the colon consistent with colitis, agree with radiology report   PROCEDURES:  Critical Care performed: No  Procedures    MEDICATIONS ORDERED IN ED: Medications  ondansetron (ZOFRAN-ODT) disintegrating tablet 4 mg (4 mg Oral Given 03/05/21 1639)  famotidine (PEPCID) tablet 20 mg (20 mg Oral Given 03/05/21 1637)  alum & mag hydroxide-simeth (MAALOX/MYLANTA) 200-200-20 MG/5ML suspension 30 mL (30 mLs Oral Given 03/05/21 1637)  iohexol (OMNIPAQUE) 300 MG/ML  solution 100 mL (100 mLs Intravenous Contrast Given 03/05/21 1649)     IMPRESSION / MDM / ASSESSMENT AND PLAN / ED COURSE  I reviewed the triage vital signs and the nursing notes.                              Differential diagnosis includes, but is not limited to, viral gastroenteritis, colitis, diverticulitis, biliary colic, pancreatitis  Patient is a 61 year old female presenting with abdominal pain nausea vomiting and diarrhea.  Pain is rather diffuse intermittent.  She has no associated fever.  Vital signs within normal limits.  Patient overall appears well on exam she appears well-hydrated.  Abdomen is soft and exam is benign but she is tender in the left lower quadrant.  Lab work is overall reassuring, no leukocytosis normal lipase and CMP.  Patient is immunosuppressed, chronically on steroids.  Obtain a CT abdomen pelvis to rule out surgical pathology such as diverticulitis which does show some nonspecific thickening of the bowel wall which radiology comments could be seen  in colitis.  This is consistent with her presentation which is likely a viral syndrome.  She was given GI cocktail and Pepcid and was able to tolerate p.o.  Will discharge with Pepcid and Zofran.      FINAL CLINICAL IMPRESSION(S) / ED DIAGNOSES   Final diagnoses:  Abdominal pain, unspecified abdominal location  Colitis     Rx / DC Orders   ED Discharge Orders          Ordered    ondansetron (ZOFRAN) 4 MG tablet  Daily PRN        03/05/21 1721    famotidine (PEPCID) 20 MG tablet  2 times daily        03/05/21 1721             Note:  This document was prepared using Dragon voice recognition software and may include unintentional dictation errors.   Rada Hay, MD 03/06/21 [phone]

## 2021-03-05 NOTE — ED Notes (Signed)
Patient transported to CT 

## 2021-03-05 NOTE — Discharge Instructions (Addendum)
Your CAT scan shows that you have some inflammation of your bowel which is likely from a viral infection.  Your blood work was otherwise reassuring.  You can take the Pepcid for abdominal discomfort and the Zofran for nausea.  Please follow-up with your primary care provider.  If your pain is worsening you develop fevers or unable to eat or drink, please return to the emergency department.

## 2021-03-05 NOTE — ED Triage Notes (Signed)
Pt sent from [facility] with c/o abd pain with N/V/D since thursday

## 2021-03-05 NOTE — ED Notes (Signed)
Dr. Sidney Ace MD at bedside

## 2021-04-04 ENCOUNTER — Ambulatory Visit: Admit: 2021-04-04 | Discharge: 2021-04-05 | Attending: Rheumatology | Primary: Rheumatology

## 2021-04-04 DIAGNOSIS — M05731 Rheumatoid arthritis with rheumatoid factor of right wrist without organ or systems involvement: Principal | ICD-10-CM

## 2021-04-04 DIAGNOSIS — M05732 Rheumatoid arthritis with rheumatoid factor of left wrist without organ or systems involvement: Principal | ICD-10-CM

## 2021-04-04 DIAGNOSIS — M05742 Rheumatoid arthritis with rheumatoid factor of left hand without organ or systems involvement: Principal | ICD-10-CM

## 2021-04-04 DIAGNOSIS — M05741 Rheumatoid arthritis with rheumatoid factor of right hand without organ or systems involvement: Principal | ICD-10-CM

## 2021-04-04 DIAGNOSIS — M810 Age-related osteoporosis without current pathological fracture: Principal | ICD-10-CM

## 2021-04-04 MED ORDER — ACTEMRA ACTPEN 162 MG/0.9 ML SUBCUTANEOUS PEN INJECTOR
SUBCUTANEOUS | 6 refills | 0 days | Status: CP
Start: 2021-04-04 — End: ?

## 2021-04-04 MED ORDER — PREDNISONE 5 MG TABLET
ORAL_TABLET | Freq: Every day | ORAL | 5 refills | 30.00000 days | Status: CP
Start: 2021-04-04 — End: ?

## 2021-04-04 MED ORDER — LEFLUNOMIDE 20 MG TABLET
ORAL_TABLET | Freq: Every day | ORAL | 10 refills | 30.00000 days | Status: CP
Start: 2021-04-04 — End: ?

## 2021-04-05 DIAGNOSIS — M05741 Rheumatoid arthritis with rheumatoid factor of right hand without organ or systems involvement: Principal | ICD-10-CM

## 2021-04-05 DIAGNOSIS — M05742 Rheumatoid arthritis with rheumatoid factor of left hand without organ or systems involvement: Principal | ICD-10-CM

## 2021-04-26 ENCOUNTER — Ambulatory Visit
Admit: 2021-04-26 | Discharge: 2021-04-26 | Attending: Student in an Organized Health Care Education/Training Program | Primary: Student in an Organized Health Care Education/Training Program

## 2021-04-26 ENCOUNTER — Ambulatory Visit: Admit: 2021-04-26 | Discharge: 2021-04-26

## 2021-04-26 DIAGNOSIS — M059 Rheumatoid arthritis with rheumatoid factor, unspecified: Principal | ICD-10-CM

## 2021-04-26 DIAGNOSIS — J45901 Unspecified asthma with (acute) exacerbation: Principal | ICD-10-CM

## 2021-04-26 DIAGNOSIS — R059 Cough, unspecified type: Principal | ICD-10-CM

## 2021-04-26 DIAGNOSIS — R062 Wheezing: Principal | ICD-10-CM

## 2021-04-26 DIAGNOSIS — Z79899 Other long term (current) drug therapy: Principal | ICD-10-CM

## 2021-04-26 MED ORDER — PREDNISONE 20 MG TABLET
ORAL_TABLET | Freq: Every day | ORAL | 0 refills | 2 days | Status: CP
Start: 2021-04-26 — End: ?

## 2021-04-26 MED ORDER — AZITHROMYCIN 250 MG TABLET
ORAL_TABLET | 0 refills | 5 days | Status: CP
Start: 2021-04-26 — End: 2021-05-01

## 2021-05-01 ENCOUNTER — Ambulatory Visit: Admit: 2021-05-01 | Discharge: 2021-05-02

## 2021-05-30 DIAGNOSIS — Z1239 Encounter for other screening for malignant neoplasm of breast: Principal | ICD-10-CM

## 2021-07-13 ENCOUNTER — Ambulatory Visit: Admit: 2021-07-13 | Discharge: 2021-07-13

## 2021-07-13 DIAGNOSIS — M05742 Rheumatoid arthritis with rheumatoid factor of left hand without organ or systems involvement: Principal | ICD-10-CM

## 2021-07-13 DIAGNOSIS — M810 Age-related osteoporosis without current pathological fracture: Principal | ICD-10-CM

## 2021-07-13 DIAGNOSIS — Z79899 Other long term (current) drug therapy: Principal | ICD-10-CM

## 2021-07-13 DIAGNOSIS — M059 Rheumatoid arthritis with rheumatoid factor, unspecified: Principal | ICD-10-CM

## 2021-07-13 DIAGNOSIS — M05741 Rheumatoid arthritis with rheumatoid factor of right hand without organ or systems involvement: Principal | ICD-10-CM

## 2021-07-13 DIAGNOSIS — M5440 Lumbago with sciatica, unspecified side: Principal | ICD-10-CM

## 2021-07-13 DIAGNOSIS — R062 Wheezing: Principal | ICD-10-CM

## 2021-07-13 DIAGNOSIS — Z5181 Encounter for therapeutic drug level monitoring: Principal | ICD-10-CM

## 2021-07-27 ENCOUNTER — Ambulatory Visit: Admit: 2021-07-27 | Discharge: 2021-07-28 | Attending: Retina Specialist | Primary: Retina Specialist

## 2021-08-10 ENCOUNTER — Ambulatory Visit: Admit: 2021-08-10 | Discharge: 2021-08-11

## 2021-09-06 DIAGNOSIS — M05742 Rheumatoid arthritis with rheumatoid factor of left hand without organ or systems involvement: Principal | ICD-10-CM

## 2021-09-06 DIAGNOSIS — M05741 Rheumatoid arthritis with rheumatoid factor of right hand without organ or systems involvement: Principal | ICD-10-CM

## 2021-09-06 MED ORDER — ACTEMRA ACTPEN 162 MG/0.9 ML SUBCUTANEOUS PEN INJECTOR
SUBCUTANEOUS | 6 refills | 0.00000 days | Status: CP
Start: 2021-09-06 — End: ?

## 2021-09-26 ENCOUNTER — Ambulatory Visit: Admit: 2021-09-26 | Discharge: 2021-09-27

## 2021-09-26 DIAGNOSIS — Z1231 Encounter for screening mammogram for malignant neoplasm of breast: Principal | ICD-10-CM

## 2021-10-02 DIAGNOSIS — M810 Age-related osteoporosis without current pathological fracture: Principal | ICD-10-CM

## 2021-10-02 DIAGNOSIS — M05741 Rheumatoid arthritis with rheumatoid factor of right hand without organ or systems involvement: Principal | ICD-10-CM

## 2021-10-02 DIAGNOSIS — M05731 Rheumatoid arthritis with rheumatoid factor of right wrist without organ or systems involvement: Principal | ICD-10-CM

## 2021-10-02 DIAGNOSIS — M05732 Rheumatoid arthritis with rheumatoid factor of left wrist without organ or systems involvement: Principal | ICD-10-CM

## 2021-10-02 DIAGNOSIS — M05742 Rheumatoid arthritis with rheumatoid factor of left hand without organ or systems involvement: Principal | ICD-10-CM

## 2021-10-02 MED ORDER — LEFLUNOMIDE 20 MG TABLET
ORAL_TABLET | Freq: Every day | ORAL | 0 refills | 30 days
Start: 2021-10-02 — End: ?

## 2021-10-10 ENCOUNTER — Ambulatory Visit: Admit: 2021-10-10 | Discharge: 2021-10-11

## 2021-10-30 DIAGNOSIS — M05741 Rheumatoid arthritis with rheumatoid factor of right hand without organ or systems involvement: Principal | ICD-10-CM

## 2021-10-30 DIAGNOSIS — M05742 Rheumatoid arthritis with rheumatoid factor of left hand without organ or systems involvement: Principal | ICD-10-CM

## 2021-10-30 MED ORDER — HYDROXYCHLOROQUINE 200 MG TABLET
ORAL_TABLET | Freq: Two times a day (BID) | ORAL | 3 refills | 90.00000 days | Status: CP
Start: 2021-10-30 — End: ?

## 2021-11-07 ENCOUNTER — Ambulatory Visit: Admit: 2021-11-07 | Discharge: 2021-11-08 | Attending: Rheumatology | Primary: Rheumatology

## 2021-11-07 DIAGNOSIS — M05741 Rheumatoid arthritis with rheumatoid factor of right hand without organ or systems involvement: Principal | ICD-10-CM

## 2021-11-07 DIAGNOSIS — M810 Age-related osteoporosis without current pathological fracture: Principal | ICD-10-CM

## 2021-11-07 DIAGNOSIS — M05731 Rheumatoid arthritis with rheumatoid factor of right wrist without organ or systems involvement: Principal | ICD-10-CM

## 2021-11-07 DIAGNOSIS — Z7952 Long term (current) use of systemic steroids: Principal | ICD-10-CM

## 2021-11-07 DIAGNOSIS — M05742 Rheumatoid arthritis with rheumatoid factor of left hand without organ or systems involvement: Principal | ICD-10-CM

## 2021-11-07 DIAGNOSIS — M05732 Rheumatoid arthritis with rheumatoid factor of left wrist without organ or systems involvement: Principal | ICD-10-CM

## 2021-11-07 DIAGNOSIS — M5432 Sciatica, left side: Principal | ICD-10-CM

## 2021-11-07 MED ORDER — PREDNISONE 5 MG TABLET
ORAL_TABLET | Freq: Every day | ORAL | 5 refills | 30 days | Status: CP
Start: 2021-11-07 — End: ?

## 2021-11-07 MED ORDER — LEFLUNOMIDE 20 MG TABLET
ORAL_TABLET | Freq: Every day | ORAL | 10 refills | 30 days | Status: CP
Start: 2021-11-07 — End: ?

## 2021-11-07 MED ORDER — HYDROXYCHLOROQUINE 200 MG TABLET
ORAL_TABLET | Freq: Every day | ORAL | 3 refills | 90 days | Status: CP
Start: 2021-11-07 — End: ?

## 2021-11-13 DIAGNOSIS — M810 Age-related osteoporosis without current pathological fracture: Principal | ICD-10-CM

## 2021-11-13 MED ORDER — ALENDRONATE 70 MG TABLET
ORAL_TABLET | ORAL | 3 refills | 84 days | Status: CP
Start: 2021-11-13 — End: 2022-11-13

## 2021-12-02 DIAGNOSIS — M05742 Rheumatoid arthritis with rheumatoid factor of left hand without organ or systems involvement: Principal | ICD-10-CM

## 2021-12-02 DIAGNOSIS — M810 Age-related osteoporosis without current pathological fracture: Principal | ICD-10-CM

## 2021-12-02 DIAGNOSIS — M05741 Rheumatoid arthritis with rheumatoid factor of right hand without organ or systems involvement: Principal | ICD-10-CM

## 2021-12-14 ENCOUNTER — Ambulatory Visit: Admit: 2021-12-14 | Discharge: 2021-12-15

## 2021-12-28 ENCOUNTER — Ambulatory Visit: Admit: 2021-12-28 | Discharge: 2021-12-29

## 2021-12-28 DIAGNOSIS — M05741 Rheumatoid arthritis with rheumatoid factor of right hand without organ or systems involvement: Principal | ICD-10-CM

## 2021-12-28 DIAGNOSIS — M810 Age-related osteoporosis without current pathological fracture: Principal | ICD-10-CM

## 2021-12-28 DIAGNOSIS — M05742 Rheumatoid arthritis with rheumatoid factor of left hand without organ or systems involvement: Principal | ICD-10-CM

## 2022-01-19 DIAGNOSIS — M05742 Rheumatoid arthritis with rheumatoid factor of left hand without organ or systems involvement: Principal | ICD-10-CM

## 2022-01-19 DIAGNOSIS — M810 Age-related osteoporosis without current pathological fracture: Principal | ICD-10-CM

## 2022-01-19 DIAGNOSIS — M05741 Rheumatoid arthritis with rheumatoid factor of right hand without organ or systems involvement: Principal | ICD-10-CM

## 2022-02-08 ENCOUNTER — Ambulatory Visit: Admit: 2022-02-08 | Discharge: 2022-02-09

## 2022-02-20 ENCOUNTER — Ambulatory Visit: Admit: 2022-02-20 | Discharge: 2022-02-21

## 2022-02-20 DIAGNOSIS — J069 Acute upper respiratory infection, unspecified: Principal | ICD-10-CM

## 2022-03-04 DIAGNOSIS — M05742 Rheumatoid arthritis with rheumatoid factor of left hand without organ or systems involvement: Principal | ICD-10-CM

## 2022-03-04 DIAGNOSIS — M810 Age-related osteoporosis without current pathological fracture: Principal | ICD-10-CM

## 2022-03-04 DIAGNOSIS — M05741 Rheumatoid arthritis with rheumatoid factor of right hand without organ or systems involvement: Principal | ICD-10-CM

## 2022-03-08 ENCOUNTER — Ambulatory Visit: Admit: 2022-03-08 | Discharge: 2022-03-09

## 2022-03-13 ENCOUNTER — Ambulatory Visit: Admit: 2022-03-13 | Discharge: 2022-03-14

## 2022-03-13 DIAGNOSIS — R519 Nonintractable episodic headache, unspecified headache type: Principal | ICD-10-CM

## 2022-03-13 DIAGNOSIS — M810 Age-related osteoporosis without current pathological fracture: Principal | ICD-10-CM

## 2022-03-13 DIAGNOSIS — M05742 Rheumatoid arthritis with rheumatoid factor of left hand without organ or systems involvement: Principal | ICD-10-CM

## 2022-03-13 DIAGNOSIS — M059 Rheumatoid arthritis with rheumatoid factor, unspecified: Principal | ICD-10-CM

## 2022-03-13 DIAGNOSIS — E559 Vitamin D deficiency, unspecified: Principal | ICD-10-CM

## 2022-03-13 DIAGNOSIS — M05741 Rheumatoid arthritis with rheumatoid factor of right hand without organ or systems involvement: Principal | ICD-10-CM

## 2022-03-13 DIAGNOSIS — M05732 Rheumatoid arthritis with rheumatoid factor of left wrist without organ or systems involvement: Principal | ICD-10-CM

## 2022-03-13 DIAGNOSIS — M05731 Rheumatoid arthritis with rheumatoid factor of right wrist without organ or systems involvement: Principal | ICD-10-CM

## 2022-03-13 MED ORDER — PREDNISONE 5 MG TABLET
ORAL_TABLET | Freq: Every day | ORAL | 5 refills | 30 days | Status: CP
Start: 2022-03-13 — End: ?

## 2022-03-15 DIAGNOSIS — R519 Nonintractable episodic headache, unspecified headache type: Principal | ICD-10-CM

## 2022-03-21 ENCOUNTER — Ambulatory Visit: Admit: 2022-03-21 | Discharge: 2022-03-22

## 2022-03-27 DIAGNOSIS — E559 Vitamin D deficiency, unspecified: Principal | ICD-10-CM

## 2022-04-05 ENCOUNTER — Ambulatory Visit: Admit: 2022-04-05 | Discharge: 2022-04-06

## 2022-04-28 DIAGNOSIS — M05742 Rheumatoid arthritis with rheumatoid factor of left hand without organ or systems involvement: Principal | ICD-10-CM

## 2022-04-28 DIAGNOSIS — M05741 Rheumatoid arthritis with rheumatoid factor of right hand without organ or systems involvement: Principal | ICD-10-CM

## 2022-04-28 DIAGNOSIS — E559 Vitamin D deficiency, unspecified: Principal | ICD-10-CM

## 2022-04-28 DIAGNOSIS — M810 Age-related osteoporosis without current pathological fracture: Principal | ICD-10-CM

## 2022-05-03 ENCOUNTER — Ambulatory Visit: Admit: 2022-05-03 | Discharge: 2022-05-04

## 2022-05-26 DIAGNOSIS — M05741 Rheumatoid arthritis with rheumatoid factor of right hand without organ or systems involvement: Principal | ICD-10-CM

## 2022-05-26 DIAGNOSIS — M05742 Rheumatoid arthritis with rheumatoid factor of left hand without organ or systems involvement: Principal | ICD-10-CM

## 2022-05-26 DIAGNOSIS — M810 Age-related osteoporosis without current pathological fracture: Principal | ICD-10-CM

## 2022-05-26 DIAGNOSIS — E559 Vitamin D deficiency, unspecified: Principal | ICD-10-CM

## 2022-06-05 ENCOUNTER — Ambulatory Visit: Admit: 2022-06-05 | Discharge: 2022-06-06

## 2022-06-05 ENCOUNTER — Telehealth: Admit: 2022-06-05 | Discharge: 2022-06-06 | Attending: Rheumatology | Primary: Rheumatology

## 2022-06-05 DIAGNOSIS — M05741 Rheumatoid arthritis with rheumatoid factor of right hand without organ or systems involvement: Principal | ICD-10-CM

## 2022-06-05 DIAGNOSIS — M05732 Rheumatoid arthritis with rheumatoid factor of left wrist without organ or systems involvement: Principal | ICD-10-CM

## 2022-06-05 DIAGNOSIS — M810 Age-related osteoporosis without current pathological fracture: Principal | ICD-10-CM

## 2022-06-05 DIAGNOSIS — M05731 Rheumatoid arthritis with rheumatoid factor of right wrist without organ or systems involvement: Principal | ICD-10-CM

## 2022-06-05 DIAGNOSIS — M059 Rheumatoid arthritis with rheumatoid factor, unspecified: Principal | ICD-10-CM

## 2022-06-05 DIAGNOSIS — M05742 Rheumatoid arthritis with rheumatoid factor of left hand without organ or systems involvement: Principal | ICD-10-CM

## 2022-06-05 MED ORDER — PREDNISONE 5 MG TABLET
ORAL_TABLET | Freq: Every day | ORAL | 5 refills | 30 days | Status: CP
Start: 2022-06-05 — End: ?

## 2022-06-05 MED ORDER — LEFLUNOMIDE 20 MG TABLET
ORAL_TABLET | Freq: Every day | ORAL | 11 refills | 30 days | Status: CP
Start: 2022-06-05 — End: ?

## 2022-06-05 MED ORDER — HYDROXYCHLOROQUINE 200 MG TABLET
ORAL_TABLET | Freq: Every day | ORAL | 3 refills | 90 days | Status: CP
Start: 2022-06-05 — End: ?

## 2022-07-05 ENCOUNTER — Ambulatory Visit: Admit: 2022-07-05 | Discharge: 2022-07-06

## 2022-07-05 DIAGNOSIS — M810 Age-related osteoporosis without current pathological fracture: Principal | ICD-10-CM

## 2022-07-05 DIAGNOSIS — E559 Vitamin D deficiency, unspecified: Principal | ICD-10-CM

## 2022-07-05 DIAGNOSIS — M05741 Rheumatoid arthritis with rheumatoid factor of right hand without organ or systems involvement: Principal | ICD-10-CM

## 2022-07-05 DIAGNOSIS — M05742 Rheumatoid arthritis with rheumatoid factor of left hand without organ or systems involvement: Principal | ICD-10-CM

## 2022-07-28 DIAGNOSIS — M810 Age-related osteoporosis without current pathological fracture: Principal | ICD-10-CM

## 2022-07-28 DIAGNOSIS — E559 Vitamin D deficiency, unspecified: Principal | ICD-10-CM

## 2022-07-28 DIAGNOSIS — M05741 Rheumatoid arthritis with rheumatoid factor of right hand without organ or systems involvement: Principal | ICD-10-CM

## 2022-07-28 DIAGNOSIS — M05742 Rheumatoid arthritis with rheumatoid factor of left hand without organ or systems involvement: Principal | ICD-10-CM

## 2022-08-02 ENCOUNTER — Ambulatory Visit: Admit: 2022-08-02 | Discharge: 2022-08-03

## 2022-08-24 DIAGNOSIS — M810 Age-related osteoporosis without current pathological fracture: Principal | ICD-10-CM

## 2022-08-24 DIAGNOSIS — M05742 Rheumatoid arthritis with rheumatoid factor of left hand without organ or systems involvement: Principal | ICD-10-CM

## 2022-08-24 DIAGNOSIS — M05741 Rheumatoid arthritis with rheumatoid factor of right hand without organ or systems involvement: Principal | ICD-10-CM

## 2022-08-24 DIAGNOSIS — E559 Vitamin D deficiency, unspecified: Principal | ICD-10-CM

## 2022-08-30 ENCOUNTER — Ambulatory Visit: Admit: 2022-08-30

## 2022-09-10 ENCOUNTER — Ambulatory Visit: Admit: 2022-09-10 | Discharge: 2022-09-11

## 2022-09-16 ENCOUNTER — Ambulatory Visit: Admit: 2022-09-16

## 2022-10-03 DIAGNOSIS — E559 Vitamin D deficiency, unspecified: Principal | ICD-10-CM

## 2022-10-03 DIAGNOSIS — M05741 Rheumatoid arthritis with rheumatoid factor of right hand without organ or systems involvement: Principal | ICD-10-CM

## 2022-10-03 DIAGNOSIS — M810 Age-related osteoporosis without current pathological fracture: Principal | ICD-10-CM

## 2022-10-03 DIAGNOSIS — M05742 Rheumatoid arthritis with rheumatoid factor of left hand without organ or systems involvement: Principal | ICD-10-CM

## 2022-10-09 DIAGNOSIS — J4540 Moderate persistent asthma, uncomplicated: Principal | ICD-10-CM

## 2022-10-11 ENCOUNTER — Ambulatory Visit: Admit: 2022-10-11 | Discharge: 2022-10-12

## 2022-10-11 ENCOUNTER — Ambulatory Visit
Admit: 2022-10-11 | Discharge: 2022-10-12 | Attending: Student in an Organized Health Care Education/Training Program | Primary: Student in an Organized Health Care Education/Training Program

## 2022-10-11 DIAGNOSIS — M5416 Radiculopathy, lumbar region: Principal | ICD-10-CM

## 2022-10-16 DIAGNOSIS — R918 Other nonspecific abnormal finding of lung field: Principal | ICD-10-CM

## 2022-10-16 DIAGNOSIS — J4541 Moderate persistent asthma with (acute) exacerbation: Principal | ICD-10-CM

## 2022-10-25 ENCOUNTER — Ambulatory Visit: Admit: 2022-10-25 | Discharge: 2022-10-26

## 2022-10-25 DIAGNOSIS — E559 Vitamin D deficiency, unspecified: Principal | ICD-10-CM

## 2022-10-25 DIAGNOSIS — M810 Age-related osteoporosis without current pathological fracture: Principal | ICD-10-CM

## 2022-10-25 DIAGNOSIS — M05742 Rheumatoid arthritis with rheumatoid factor of left hand without organ or systems involvement: Principal | ICD-10-CM

## 2022-10-25 DIAGNOSIS — M05741 Rheumatoid arthritis with rheumatoid factor of right hand without organ or systems involvement: Principal | ICD-10-CM

## 2022-11-06 ENCOUNTER — Ambulatory Visit: Admit: 2022-11-06 | Discharge: 2022-11-07

## 2022-11-06 DIAGNOSIS — M059 Rheumatoid arthritis with rheumatoid factor, unspecified: Principal | ICD-10-CM

## 2022-11-06 DIAGNOSIS — M05741 Rheumatoid arthritis with rheumatoid factor of right hand without organ or systems involvement: Principal | ICD-10-CM

## 2022-11-06 DIAGNOSIS — M05742 Rheumatoid arthritis with rheumatoid factor of left hand without organ or systems involvement: Principal | ICD-10-CM

## 2022-11-06 DIAGNOSIS — M1712 Unilateral primary osteoarthritis, left knee: Principal | ICD-10-CM

## 2022-11-06 DIAGNOSIS — M810 Age-related osteoporosis without current pathological fracture: Principal | ICD-10-CM

## 2022-11-06 DIAGNOSIS — E559 Vitamin D deficiency, unspecified: Principal | ICD-10-CM

## 2022-11-06 MED ORDER — HYDROXYCHLOROQUINE 200 MG TABLET
ORAL_TABLET | Freq: Every day | ORAL | 3 refills | 90 days | Status: CP
Start: 2022-11-06 — End: ?

## 2022-11-06 MED ORDER — LEFLUNOMIDE 20 MG TABLET
ORAL_TABLET | Freq: Every day | ORAL | 11 refills | 30 days | Status: CP
Start: 2022-11-06 — End: ?

## 2022-11-06 MED ORDER — PREDNISONE 5 MG TABLET
ORAL_TABLET | Freq: Every day | ORAL | 5 refills | 30 days | Status: CP
Start: 2022-11-06 — End: ?

## 2022-11-25 ENCOUNTER — Ambulatory Visit: Admit: 2022-11-25 | Discharge: 2022-11-25

## 2022-12-04 ENCOUNTER — Ambulatory Visit: Admit: 2022-12-04 | Discharge: 2022-12-05

## 2022-12-04 DIAGNOSIS — R1310 Dysphagia, unspecified: Principal | ICD-10-CM

## 2022-12-12 DIAGNOSIS — M810 Age-related osteoporosis without current pathological fracture: Principal | ICD-10-CM

## 2022-12-12 MED ORDER — ALENDRONATE 70 MG TABLET
ORAL_TABLET | ORAL | 0 refills | 84 days | Status: CP
Start: 2022-12-12 — End: 2023-12-12

## 2022-12-13 ENCOUNTER — Ambulatory Visit: Admit: 2022-12-13 | Discharge: 2022-12-14

## 2022-12-13 DIAGNOSIS — M5416 Radiculopathy, lumbar region: Principal | ICD-10-CM

## 2022-12-13 MED ORDER — METHOCARBAMOL 500 MG TABLET
ORAL_TABLET | Freq: Four times a day (QID) | ORAL | 0 refills | 30 days | Status: CP
Start: 2022-12-13 — End: 2023-01-12

## 2022-12-20 DIAGNOSIS — E559 Vitamin D deficiency, unspecified: Principal | ICD-10-CM

## 2022-12-20 DIAGNOSIS — M810 Age-related osteoporosis without current pathological fracture: Principal | ICD-10-CM

## 2022-12-20 DIAGNOSIS — M05741 Rheumatoid arthritis with rheumatoid factor of right hand without organ or systems involvement: Principal | ICD-10-CM

## 2022-12-20 DIAGNOSIS — M05742 Rheumatoid arthritis with rheumatoid factor of left hand without organ or systems involvement: Principal | ICD-10-CM

## 2022-12-30 DIAGNOSIS — M059 Rheumatoid arthritis with rheumatoid factor, unspecified: Principal | ICD-10-CM

## 2022-12-30 MED ORDER — ENBREL SURECLICK 50 MG/ML (1 ML) SUBCUTANEOUS PEN INJECTOR
SUBCUTANEOUS | 3 refills | 84 days | Status: CP
Start: 2022-12-30 — End: ?

## 2022-12-31 DIAGNOSIS — M059 Rheumatoid arthritis with rheumatoid factor, unspecified: Principal | ICD-10-CM

## 2022-12-31 DIAGNOSIS — M05742 Rheumatoid arthritis with rheumatoid factor of left hand without organ or systems involvement: Principal | ICD-10-CM

## 2022-12-31 DIAGNOSIS — M05741 Rheumatoid arthritis with rheumatoid factor of right hand without organ or systems involvement: Principal | ICD-10-CM

## 2023-02-04 DIAGNOSIS — J189 Pneumonia, unspecified organism: Principal | ICD-10-CM

## 2023-02-04 DIAGNOSIS — R918 Other nonspecific abnormal finding of lung field: Principal | ICD-10-CM

## 2023-02-11 ENCOUNTER — Ambulatory Visit: Admit: 2023-02-11 | Discharge: 2023-02-12

## 2023-02-11 DIAGNOSIS — M5416 Radiculopathy, lumbar region: Principal | ICD-10-CM

## 2023-02-11 MED ORDER — GABAPENTIN 100 MG CAPSULE
ORAL_CAPSULE | Freq: Every evening | ORAL | 0 refills | 30.00 days | Status: CP | PRN
Start: 2023-02-11 — End: 2023-03-13

## 2023-02-21 ENCOUNTER — Encounter: Admit: 2023-02-21 | Discharge: 2023-02-21 | Attending: Anesthesiology | Primary: Anesthesiology

## 2023-02-21 ENCOUNTER — Inpatient Hospital Stay: Admit: 2023-02-21 | Discharge: 2023-02-21

## 2023-02-28 DIAGNOSIS — Z5181 Encounter for therapeutic drug level monitoring: Principal | ICD-10-CM

## 2023-02-28 DIAGNOSIS — R0609 Other forms of dyspnea: Principal | ICD-10-CM

## 2023-02-28 DIAGNOSIS — M810 Age-related osteoporosis without current pathological fracture: Principal | ICD-10-CM

## 2023-02-28 DIAGNOSIS — M059 Rheumatoid arthritis with rheumatoid factor, unspecified: Principal | ICD-10-CM

## 2023-02-28 DIAGNOSIS — R053 Chronic cough: Principal | ICD-10-CM

## 2023-02-28 DIAGNOSIS — Z79899 Other long term (current) drug therapy: Principal | ICD-10-CM

## 2023-02-28 DIAGNOSIS — E559 Vitamin D deficiency, unspecified: Principal | ICD-10-CM

## 2023-02-28 MED ORDER — HYDROXYCHLOROQUINE 200 MG TABLET
ORAL_TABLET | Freq: Every day | ORAL | 3 refills | 90.00 days | Status: CP
Start: 2023-02-28 — End: ?

## 2023-03-01 ENCOUNTER — Inpatient Hospital Stay: Admit: 2023-03-01 | Discharge: 2023-03-01

## 2023-03-01 ENCOUNTER — Ambulatory Visit: Admit: 2023-03-01 | Discharge: 2023-03-01

## 2023-03-07 ENCOUNTER — Ambulatory Visit: Admit: 2023-03-07 | Discharge: 2023-03-08

## 2023-03-07 ENCOUNTER — Ambulatory Visit
Admit: 2023-03-07 | Attending: Rehabilitative and Restorative Service Providers" | Primary: Rehabilitative and Restorative Service Providers"

## 2023-03-07 DIAGNOSIS — M5416 Radiculopathy, lumbar region: Principal | ICD-10-CM

## 2023-03-24 DIAGNOSIS — M5416 Radiculopathy, lumbar region: Principal | ICD-10-CM

## 2023-03-25 ENCOUNTER — Ambulatory Visit: Admit: 2023-03-25 | Discharge: 2023-03-25

## 2023-03-25 DIAGNOSIS — M5416 Radiculopathy, lumbar region: Principal | ICD-10-CM

## 2023-04-01 ENCOUNTER — Inpatient Hospital Stay: Admit: 2023-04-01 | Discharge: 2023-04-02

## 2023-04-09 ENCOUNTER — Ambulatory Visit: Admit: 2023-04-09

## 2023-04-16 DIAGNOSIS — R0602 Shortness of breath: Principal | ICD-10-CM

## 2023-04-18 ENCOUNTER — Inpatient Hospital Stay: Admit: 2023-04-18 | Discharge: 2023-04-18

## 2023-04-18 ENCOUNTER — Ambulatory Visit
Admit: 2023-04-18 | Attending: Rehabilitative and Restorative Service Providers" | Primary: Rehabilitative and Restorative Service Providers"

## 2023-04-30 ENCOUNTER — Ambulatory Visit
Admit: 2023-04-30 | Discharge: 2023-05-01 | Attending: Student in an Organized Health Care Education/Training Program | Primary: Student in an Organized Health Care Education/Training Program

## 2023-04-30 DIAGNOSIS — M059 Rheumatoid arthritis with rheumatoid factor, unspecified: Principal | ICD-10-CM

## 2023-04-30 DIAGNOSIS — R053 Chronic cough: Principal | ICD-10-CM

## 2023-04-30 DIAGNOSIS — R0609 Other forms of dyspnea: Principal | ICD-10-CM

## 2023-04-30 DIAGNOSIS — J4550 Severe persistent asthma, uncomplicated: Principal | ICD-10-CM

## 2023-04-30 MED ORDER — AZITHROMYCIN 250 MG TABLET
ORAL_TABLET | Freq: Every day | ORAL | 6 refills | 30.00 days | Status: CP
Start: 2023-04-30 — End: ?

## 2023-05-02 ENCOUNTER — Ambulatory Visit
Admit: 2023-05-02 | Attending: Rehabilitative and Restorative Service Providers" | Primary: Rehabilitative and Restorative Service Providers"

## 2023-05-02 ENCOUNTER — Ambulatory Visit
Admit: 2023-05-02 | Discharge: 2023-05-05 | Attending: Rehabilitative and Restorative Service Providers" | Primary: Rehabilitative and Restorative Service Providers"

## 2023-05-07 DIAGNOSIS — J45909 Unspecified asthma, uncomplicated: Principal | ICD-10-CM

## 2023-05-07 MED ORDER — DUPIXENT 300 MG/2 ML SUBCUTANEOUS PEN INJECTOR
Freq: Once | SUBCUTANEOUS | 0 refills | 1.00 days | Status: CP
Start: 2023-05-07 — End: 2023-05-07

## 2023-05-08 DIAGNOSIS — J45909 Unspecified asthma, uncomplicated: Principal | ICD-10-CM

## 2023-05-16 ENCOUNTER — Ambulatory Visit
Admit: 2023-05-16 | Attending: Rehabilitative and Restorative Service Providers" | Primary: Rehabilitative and Restorative Service Providers"

## 2023-05-21 ENCOUNTER — Ambulatory Visit: Admit: 2023-05-21 | Discharge: 2023-05-22 | Attending: Rheumatology | Primary: Rheumatology

## 2023-05-21 DIAGNOSIS — Z7952 Long term (current) use of systemic steroids: Principal | ICD-10-CM

## 2023-05-21 DIAGNOSIS — M05742 Rheumatoid arthritis with rheumatoid factor of left hand without organ or systems involvement: Principal | ICD-10-CM

## 2023-05-21 DIAGNOSIS — M059 Rheumatoid arthritis with rheumatoid factor, unspecified: Principal | ICD-10-CM

## 2023-05-21 DIAGNOSIS — M810 Age-related osteoporosis without current pathological fracture: Principal | ICD-10-CM

## 2023-05-21 DIAGNOSIS — M05741 Rheumatoid arthritis with rheumatoid factor of right hand without organ or systems involvement: Principal | ICD-10-CM

## 2023-05-21 DIAGNOSIS — J45909 Unspecified asthma, uncomplicated: Principal | ICD-10-CM

## 2023-05-21 MED ORDER — LEFLUNOMIDE 20 MG TABLET
ORAL_TABLET | Freq: Every day | ORAL | 11 refills | 30 days | Status: CP
Start: 2023-05-21 — End: ?

## 2023-05-21 MED ORDER — HYDROXYCHLOROQUINE 200 MG TABLET
ORAL_TABLET | Freq: Every day | ORAL | 3 refills | 90 days | Status: CP
Start: 2023-05-21 — End: ?

## 2023-05-21 MED ORDER — ENBREL SURECLICK 50 MG/ML (1 ML) SUBCUTANEOUS PEN INJECTOR
SUBCUTANEOUS | 6 refills | 28 days | Status: CP
Start: 2023-05-21 — End: ?

## 2023-05-21 MED ORDER — ALENDRONATE 70 MG TABLET
ORAL_TABLET | ORAL | 0 refills | 84 days | Status: CP
Start: 2023-05-21 — End: 2024-05-20

## 2023-05-21 MED ORDER — PREDNISONE 5 MG TABLET
ORAL_TABLET | Freq: Every day | ORAL | 5 refills | 30 days | Status: CP
Start: 2023-05-21 — End: ?

## 2023-06-02 DIAGNOSIS — M810 Age-related osteoporosis without current pathological fracture: Principal | ICD-10-CM

## 2023-06-02 DIAGNOSIS — M05741 Rheumatoid arthritis with rheumatoid factor of right hand without organ or systems involvement: Principal | ICD-10-CM

## 2023-06-02 DIAGNOSIS — M05742 Rheumatoid arthritis with rheumatoid factor of left hand without organ or systems involvement: Principal | ICD-10-CM

## 2023-06-02 MED ORDER — ALENDRONATE 70 MG TABLET
ORAL_TABLET | ORAL | 2 refills | 84.00 days
Start: 2023-06-02 — End: 2024-06-01

## 2023-06-06 ENCOUNTER — Ambulatory Visit
Admit: 2023-06-06 | Attending: Rehabilitative and Restorative Service Providers" | Primary: Rehabilitative and Restorative Service Providers"

## 2023-06-19 DIAGNOSIS — M05742 Rheumatoid arthritis with rheumatoid factor of left hand without organ or systems involvement: Principal | ICD-10-CM

## 2023-06-19 DIAGNOSIS — M05741 Rheumatoid arthritis with rheumatoid factor of right hand without organ or systems involvement: Principal | ICD-10-CM

## 2023-06-19 MED ORDER — ENBREL SURECLICK 50 MG/ML (1 ML) SUBCUTANEOUS PEN INJECTOR
SUBCUTANEOUS | 3 refills | 84.00000 days | Status: CP
Start: 2023-06-19 — End: ?

## 2023-06-20 ENCOUNTER — Ambulatory Visit: Admit: 2023-06-20 | Discharge: 2023-06-21

## 2023-06-20 DIAGNOSIS — Z79899 Other long term (current) drug therapy: Principal | ICD-10-CM

## 2023-06-20 DIAGNOSIS — M059 Rheumatoid arthritis with rheumatoid factor, unspecified: Principal | ICD-10-CM

## 2023-06-20 DIAGNOSIS — M05742 Rheumatoid arthritis with rheumatoid factor of left hand without organ or systems involvement: Principal | ICD-10-CM

## 2023-06-20 DIAGNOSIS — M05741 Rheumatoid arthritis with rheumatoid factor of right hand without organ or systems involvement: Principal | ICD-10-CM

## 2023-07-04 ENCOUNTER — Inpatient Hospital Stay: Admit: 2023-07-04 | Discharge: 2023-07-04

## 2023-07-04 ENCOUNTER — Inpatient Hospital Stay
Admit: 2023-07-04 | Discharge: 2023-07-04 | Attending: Speech-Language Pathologist | Primary: Speech-Language Pathologist

## 2023-08-21 DIAGNOSIS — M05741 Rheumatoid arthritis with rheumatoid factor of right hand without organ or systems involvement: Principal | ICD-10-CM

## 2023-08-21 DIAGNOSIS — M810 Age-related osteoporosis without current pathological fracture: Principal | ICD-10-CM

## 2023-08-21 DIAGNOSIS — M05742 Rheumatoid arthritis with rheumatoid factor of left hand without organ or systems involvement: Principal | ICD-10-CM

## 2023-08-21 MED ORDER — ALENDRONATE 70 MG TABLET
ORAL_TABLET | ORAL | 3 refills | 84.00000 days | Status: CP
Start: 2023-08-21 — End: 2024-08-20

## 2023-09-26 ENCOUNTER — Encounter: Admit: 2023-09-26 | Discharge: 2023-09-27

## 2023-09-26 ENCOUNTER — Ambulatory Visit: Admit: 2023-09-26 | Discharge: 2023-09-27

## 2023-09-26 DIAGNOSIS — Z5181 Encounter for therapeutic drug level monitoring: Principal | ICD-10-CM

## 2023-09-26 DIAGNOSIS — M810 Age-related osteoporosis without current pathological fracture: Principal | ICD-10-CM

## 2023-09-26 DIAGNOSIS — Z7952 Long term (current) use of systemic steroids: Principal | ICD-10-CM

## 2023-09-26 DIAGNOSIS — M059 Rheumatoid arthritis with rheumatoid factor, unspecified: Principal | ICD-10-CM

## 2023-09-26 DIAGNOSIS — Z7969 Encounter for monitoring leflunomide therapy: Principal | ICD-10-CM

## 2023-09-26 DIAGNOSIS — E559 Vitamin D deficiency, unspecified: Principal | ICD-10-CM

## 2023-09-26 MED ORDER — LEFLUNOMIDE 20 MG TABLET
ORAL_TABLET | Freq: Every day | ORAL | 3 refills | 90.00000 days | Status: CP
Start: 2023-09-26 — End: ?

## 2023-09-26 MED ORDER — HYDROXYCHLOROQUINE 200 MG TABLET
ORAL_TABLET | Freq: Every day | ORAL | 3 refills | 90.00000 days | Status: CP
Start: 2023-09-26 — End: ?

## 2023-09-26 MED ORDER — PREDNISONE 5 MG TABLET
ORAL_TABLET | Freq: Every day | ORAL | 1 refills | 90.00000 days | Status: CP
Start: 2023-09-26 — End: ?

## 2023-10-06 MED ORDER — CHOLECALCIFEROL (VITAMIN D3) 50 MCG (2,000 UNIT) TABLET
ORAL_TABLET | Freq: Every day | ORAL | 3 refills | 90.00000 days | Status: CP
Start: 2023-10-06 — End: ?

## 2023-11-21 ENCOUNTER — Encounter: Payer: Self-pay | Admitting: Sleep Medicine

## 2024-01-28 ENCOUNTER — Ambulatory Visit: Admit: 2024-01-28 | Discharge: 2024-01-29 | Attending: Rheumatology | Primary: Rheumatology

## 2024-01-28 DIAGNOSIS — M0579 Rheumatoid arthritis with rheumatoid factor of multiple sites without organ or systems involvement: Principal | ICD-10-CM

## 2024-01-28 DIAGNOSIS — M810 Age-related osteoporosis without current pathological fracture: Principal | ICD-10-CM

## 2024-01-28 DIAGNOSIS — M05742 Rheumatoid arthritis with rheumatoid factor of left hand without organ or systems involvement: Principal | ICD-10-CM

## 2024-01-28 DIAGNOSIS — M05741 Rheumatoid arthritis with rheumatoid factor of right hand without organ or systems involvement: Principal | ICD-10-CM
